# Patient Record
Sex: Female | Born: 1995 | Race: White | Hispanic: No | Marital: Single | Smoking: Never smoker
Health system: Southern US, Community
[De-identification: ages and names within clinical notes are randomized; demographics above are authoritative.]

---

## 2015-02-02 ENCOUNTER — Emergency Department (HOSPITAL_COMMUNITY): Payer: PRIVATE HEALTH INSURANCE

## 2015-02-02 ENCOUNTER — Encounter (HOSPITAL_COMMUNITY): Payer: Self-pay

## 2015-02-02 ENCOUNTER — Inpatient Hospital Stay (HOSPITAL_COMMUNITY): Payer: PRIVATE HEALTH INSURANCE

## 2015-02-02 ENCOUNTER — Inpatient Hospital Stay (HOSPITAL_COMMUNITY)
Admission: EM | Admit: 2015-02-02 | Discharge: 2015-02-05 | DRG: 419 | Disposition: A | Discharge status: Home / Self Care | Payer: PRIVATE HEALTH INSURANCE | Attending: Surgery | Admitting: Surgery

## 2015-02-02 DIAGNOSIS — R319 Hematuria, unspecified: Secondary | ICD-10-CM | POA: Diagnosis present

## 2015-02-02 DIAGNOSIS — F1121 Opioid dependence, in remission: Secondary | ICD-10-CM | POA: Diagnosis present

## 2015-02-02 DIAGNOSIS — R1013 Epigastric pain: Secondary | ICD-10-CM | POA: Diagnosis present

## 2015-02-02 DIAGNOSIS — Z9049 Acquired absence of other specified parts of digestive tract: Secondary | ICD-10-CM

## 2015-02-02 DIAGNOSIS — Z419 Encounter for procedure for purposes other than remedying health state, unspecified: Secondary | ICD-10-CM

## 2015-02-02 DIAGNOSIS — K810 Acute cholecystitis: Principal | ICD-10-CM | POA: Diagnosis present

## 2015-02-02 DIAGNOSIS — R1011 Right upper quadrant pain: Secondary | ICD-10-CM

## 2015-02-02 LAB — URINALYSIS, ROUTINE W REFLEX MICROSCOPIC
BILIRUBIN URINE: NEGATIVE
GLUCOSE, UA: NEGATIVE mg/dL
HGB URINE DIPSTICK: NEGATIVE
Ketones, ur: >80 mg/dL — AB
Leukocytes, UA: NEGATIVE
NITRITE: NEGATIVE
PH: 7 (ref 5.0–8.0)
Protein, ur: NEGATIVE mg/dL
SPECIFIC GRAVITY, URINE: 1.022 (ref 1.005–1.030)

## 2015-02-02 LAB — CBC
HEMATOCRIT: 39.2 % (ref 36.0–46.0)
HEMOGLOBIN: 13.3 g/dL (ref 12.0–15.0)
MCH: 29.8 pg (ref 26.0–34.0)
MCHC: 33.9 g/dL (ref 30.0–36.0)
MCV: 87.7 fL (ref 78.0–100.0)
Platelets: 299 10*3/uL (ref 150–400)
RBC: 4.47 MIL/uL (ref 3.87–5.11)
RDW: 13.3 % (ref 11.5–15.5)
WBC: 23.2 10*3/uL — ABNORMAL HIGH (ref 4.0–10.5)

## 2015-02-02 LAB — COMPREHENSIVE METABOLIC PANEL
ALBUMIN: 4.6 g/dL (ref 3.5–5.0)
ALT: 13 U/L — ABNORMAL LOW (ref 14–54)
ANION GAP: 12 (ref 5–15)
AST: 21 U/L (ref 15–41)
Alkaline Phosphatase: 54 U/L (ref 38–126)
BUN: 12 mg/dL (ref 6–20)
CHLORIDE: 106 mmol/L (ref 101–111)
CO2: 21 mmol/L — AB (ref 22–32)
Calcium: 9.8 mg/dL (ref 8.9–10.3)
Creatinine, Ser: 0.73 mg/dL (ref 0.44–1.00)
GFR calc Af Amer: >60 mL/min (ref >60)
GFR calc non Af Amer: >60 mL/min (ref >60)
GLUCOSE: 147 mg/dL — AB (ref 65–99)
POTASSIUM: 3.1 mmol/L — AB (ref 3.5–5.1)
SODIUM: 139 mmol/L (ref 135–145)
Total Bilirubin: 2.1 mg/dL — ABNORMAL HIGH (ref 0.3–1.2)
Total Protein: 8.3 g/dL — ABNORMAL HIGH (ref 6.5–8.1)

## 2015-02-02 LAB — I-STAT BETA HCG BLOOD, ED (MC, WL, AP ONLY)

## 2015-02-02 LAB — WET PREP, GENITAL
Clue Cells Wet Prep HPF POC: NONE SEEN
SPERM: NONE SEEN
TRICH WET PREP: NONE SEEN
Yeast Wet Prep HPF POC: NONE SEEN

## 2015-02-02 LAB — LIPASE, BLOOD: LIPASE: 20 U/L (ref 11–51)

## 2015-02-02 MED ORDER — PANTOPRAZOLE SODIUM 40 MG IV SOLR
40.0000 mg | Freq: Every day | INTRAVENOUS | Status: DC
  Administered 2015-02-02 – 2015-02-03 (×2): 40 mg via INTRAVENOUS
  Filled 2015-02-02 (×3): qty 40

## 2015-02-02 MED ORDER — IOHEXOL 300 MG/ML  SOLN
50.0000 mL | Freq: Once | INTRAMUSCULAR | Status: DC | PRN
  Administered 2015-02-02: 50 mL via ORAL
  Filled 2015-02-02: qty 50

## 2015-02-02 MED ORDER — MORPHINE SULFATE (PF) 4 MG/ML IV SOLN
4.0000 mg | Freq: Once | INTRAVENOUS | Status: AC
  Administered 2015-02-02: 4 mg via INTRAVENOUS
  Filled 2015-02-02: qty 1

## 2015-02-02 MED ORDER — HEPARIN SODIUM (PORCINE) 5000 UNIT/ML IJ SOLN
5000.0000 [IU] | Freq: Three times a day (TID) | INTRAMUSCULAR | Status: DC
  Administered 2015-02-02 – 2015-02-03 (×4): 5000 [IU] via SUBCUTANEOUS
  Filled 2015-02-02 (×8): qty 1

## 2015-02-02 MED ORDER — KCL IN DEXTROSE-NACL 20-5-0.9 MEQ/L-%-% IV SOLN
INTRAVENOUS | Status: DC
  Administered 2015-02-02 – 2015-02-04 (×5): via INTRAVENOUS
  Filled 2015-02-02 (×6): qty 1000

## 2015-02-02 MED ORDER — SODIUM CHLORIDE 0.9 % IV SOLN: INTRAVENOUS | Status: DC

## 2015-02-02 MED ORDER — ONDANSETRON 4 MG PO TBDP: 4.0000 mg | ORAL_TABLET | Freq: Four times a day (QID) | ORAL | Status: DC | PRN

## 2015-02-02 MED ORDER — SODIUM CHLORIDE 0.9 % IV SOLN
INTRAVENOUS | Status: DC
  Administered 2015-02-02: 19:00:00 via INTRAVENOUS

## 2015-02-02 MED ORDER — POTASSIUM CHLORIDE 10 MEQ/100ML IV SOLN
10.0000 meq | Freq: Once | INTRAVENOUS | Status: DC
  Administered 2015-02-02: 10 meq via INTRAVENOUS
  Filled 2015-02-02: qty 100

## 2015-02-02 MED ORDER — ONDANSETRON HCL 4 MG/2ML IJ SOLN
4.0000 mg | Freq: Four times a day (QID) | INTRAMUSCULAR | Status: DC | PRN
  Administered 2015-02-03 – 2015-02-04 (×2): 4 mg via INTRAVENOUS
  Filled 2015-02-02 (×2): qty 2

## 2015-02-02 MED ORDER — ONDANSETRON HCL 4 MG/2ML IJ SOLN
4.0000 mg | Freq: Once | INTRAMUSCULAR | Status: AC
  Administered 2015-02-02: 4 mg via INTRAVENOUS
  Filled 2015-02-02: qty 2

## 2015-02-02 MED ORDER — IOHEXOL 300 MG/ML  SOLN
80.0000 mL | Freq: Once | INTRAMUSCULAR | Status: AC | PRN
  Administered 2015-02-02: 80 mL via INTRAVENOUS

## 2015-02-02 MED ORDER — PIPERACILLIN-TAZOBACTAM 3.375 G IVPB
3.3750 g | Freq: Three times a day (TID) | INTRAVENOUS | Status: DC
  Administered 2015-02-03 – 2015-02-04 (×4): 3.375 g via INTRAVENOUS
  Filled 2015-02-02 (×5): qty 50

## 2015-02-02 MED ORDER — MORPHINE SULFATE (PF) 2 MG/ML IV SOLN
2.0000 mg | INTRAVENOUS | Status: DC | PRN
  Administered 2015-02-02 – 2015-02-04 (×4): 4 mg via INTRAVENOUS
  Filled 2015-02-02 (×4): qty 2

## 2015-02-02 MED ORDER — METOCLOPRAMIDE HCL 5 MG/ML IJ SOLN
10.0000 mg | Freq: Once | INTRAMUSCULAR | Status: AC
  Administered 2015-02-02: 10 mg via INTRAVENOUS
  Filled 2015-02-02: qty 2

## 2015-02-02 MED ORDER — PIPERACILLIN-TAZOBACTAM 3.375 G IVPB
3.3750 g | Freq: Once | INTRAVENOUS | Status: AC
  Administered 2015-02-02: 3.375 g via INTRAVENOUS
  Filled 2015-02-02: qty 50

## 2015-02-02 MED ORDER — SODIUM CHLORIDE 0.9 % IV BOLUS (SEPSIS)
1000.0000 mL | Freq: Once | INTRAVENOUS | Status: AC
  Administered 2015-02-02: 1000 mL via INTRAVENOUS

## 2015-02-02 MED ORDER — POTASSIUM CHLORIDE 10 MEQ/100ML IV SOLN
10.0000 meq | Freq: Once | INTRAVENOUS | Status: AC
  Administered 2015-02-02: 10 meq via INTRAVENOUS
  Filled 2015-02-02: qty 100

## 2015-02-02 NOTE — Progress Notes (Signed)
Cm still offered pt resources for [place] for providers, [employer] de[facility], DSS, SSI office, dental resources, housing resources and medication resources Discussed www.needymeds.org, www.goodrx.com, discounted pharmacies and other [place] resources such as [facility] , [facility], affordable care act, financial assistance, uninsured dental services, [place] med assist, DSS and  health department  Reviewed resources for [place] uninsured accepting pcps like [facility], [facility] medicine [facility] [address], [facility], [facility], local urgent care centers, [facility], [facility], general medical clinics, [facility] [place], [facility] plus others, medication resources, [facility] out patient [facility]ies and housing Pt voiced understanding and appreciation of resources provided

## 2015-02-02 NOTE — ED Provider Notes (Signed)
1720 5 PM patient continues to complain of upper abdominal pain, localized to right upper quadrant pain associated with nausea. Pain started last night after eating pizza. She's had similar pain in the past after eating "heavy meals". On exam she is alert and nontoxic abdomen nondistended, tender over right upper quadrant negative Murphy sign. I suspect the patient has acute cholecystitis in light of right upper quadrant pain, tenderness, gallbladder wall thickening, leukocytosis I consulted with Dr. Johna Sheriff who will arrange for admission. Intravenous antibiotics and intravenous hydration. He will check on abdominal ultrasound. Results for orders placed or performed during the hospital encounter of 02/02/15  Wet prep, genital  Result Value Ref Range   Yeast Wet Prep HPF POC NONE SEEN NONE SEEN   Trich, Wet Prep NONE SEEN NONE SEEN   Clue Cells Wet Prep HPF POC NONE SEEN NONE SEEN   WBC, Wet Prep HPF POC MODERATE (A) NONE SEEN   Sperm NONE SEEN   Lipase, blood  Result Value Ref Range   Lipase 20 11 - 51 U/L  Comprehensive metabolic panel  Result Value Ref Range   Sodium 139 135 - 145 mmol/L   Potassium 3.1 (L) 3.5 - 5.1 mmol/L   Chloride 106 101 - 111 mmol/L   CO2 21 (L) 22 - 32 mmol/L   Glucose, Bld 147 (H) 65 - 99 mg/dL   BUN 12 6 - 20 mg/dL   Creatinine, Ser 4.09 0.44 - 1.00 mg/dL   Calcium 9.8 8.9 - 81.1 mg/dL   Total Protein 8.3 (H) 6.5 - 8.1 g/dL   Albumin 4.6 3.5 - 5.0 g/dL   AST 21 15 - 41 U/L   ALT 13 (L) 14 - 54 U/L   Alkaline Phosphatase 54 38 - 126 U/L   Total Bilirubin 2.1 (H) 0.3 - 1.2 mg/dL   GFR calc non Af Amer >60 >60 mL/min   GFR calc Af Amer >60 >60 mL/min   Anion gap 12 5 - 15  CBC  Result Value Ref Range   WBC 23.2 (H) 4.0 - 10.5 K/uL   RBC 4.47 3.87 - 5.11 MIL/uL   Hemoglobin 13.3 12.0 - 15.0 g/dL   HCT 91.4 78.2 - 95.6 %   MCV 87.7 78.0 - 100.0 fL   MCH 29.8 26.0 - 34.0 pg   MCHC 33.9 30.0 - 36.0 g/dL   RDW 21.3 08.6 - 57.8 %   Platelets 299 150 - 400  K/uL  Urinalysis, Routine w reflex microscopic (not at [facility])  Result Value Ref Range   Color, Urine YELLOW YELLOW   APPearance CLOUDY (A) CLEAR   Specific Gravity, Urine 1.022 1.005 - 1.030   pH 7.0 5.0 - 8.0   Glucose, UA NEGATIVE NEGATIVE mg/dL   Hgb urine dipstick NEGATIVE NEGATIVE   Bilirubin Urine NEGATIVE NEGATIVE   Ketones, ur >80 (A) NEGATIVE mg/dL   Protein, ur NEGATIVE NEGATIVE mg/dL   Nitrite NEGATIVE NEGATIVE   Leukocytes, UA NEGATIVE NEGATIVE  I-Stat beta hCG blood, ED ([facility], [facility], [facility] only)  Result Value Ref Range   I-stat hCG, quantitative <5.0 <5 mIU/mL   Comment 3           Ct Abdomen Pelvis W Contrast  02/02/2015  CLINICAL DATA:  19 year old with mid epigastric abdominal pain. Non bilious emesis. Hematuria. EXAM: CT ABDOMEN AND PELVIS WITH CONTRAST TECHNIQUE: Multidetector CT imaging of the abdomen and pelvis was performed using the standard protocol following bolus administration of intravenous contrast. CONTRAST:  50mL OMNIPAQUE IOHEXOL  300 MG/ML SOLN, 80mL OMNIPAQUE IOHEXOL 300 MG/ML SOLN COMPARISON:  None. FINDINGS: Lower chest: Subtle ground-glass and patchy densities at the lung bases, particularly in the right middle lobe and right lower lobe. Findings concerning for infection. Hepatobiliary: There is periportal edema in the liver. 1.1 cm enhancing or dense lesion along the left hepatic dome could represent a flash filling hemangioma. Portal venous system is patent. There appears to be gallbladder wall thickening. Difficult to exclude pericholecystic fluid. Normal caliber of the common bile duct. Pancreas: Normal appearance of the pancreas without inflammation or duct dilatation. Spleen: Normal appearance of spleen without inflammation. Adrenals/Urinary Tract: 0.8 cm cyst in the right kidney upper pole. Normal appearance of the adrenal glands. Incidentally, there is a circumaortic left renal vein. Evidence for nonobstructive punctate stone in left kidney lower pole. Probable  extrarenal pelvis on the left side. Stomach/Bowel: There is marked narrowing of the proximal duodenum from the gallbladder. Normal appearance of small bowel without dilatation. Normal appearance of the colon. Appendix identified in the right pelvic region without inflammatory changes. Vascular/Lymphatic: Small low-density nodes in the left periaortic region. Normal caliber of the abdominal aorta and iliac arteries. IVC and iliac veins are patent. Reproductive: Small amount of free fluid in the pelvis. Limited evaluation of the uterus and adnexal tissue. Other: Negative for free air. Musculoskeletal: No acute abnormality. IMPRESSION: Subtle parenchymal densities at the lung bases, particularly on the right side. Findings are concerning for subtle infectious or inflammatory process. Unusual configuration of the gallbladder raises concern for wall thickening and cannot exclude pericholecystic fluid. In addition, there is periportal edema. Recommend further characterization with a right upper quadrant ultrasound. Nonobstructive left kidney stone. Multiple small low-density nodes along the left side of the aorta. These findings are nonspecific and could be reactive in etiology. No other significant lymphadenopathy. Small amount of free fluid in the pelvis could be physiologic but indeterminate. 1.1 cm dense or enhancing lesion along the left hepatic dome. This is likely an incidental finding for a patient of this age and could represent a flash filling hemangioma. Electronically Signed   By: Richarda OverlieAdam  Henn M.D.   On: 02/02/2015 16:54     Doug SouSam Rosie Torrez, MD 02/03/15 [phone]

## 2015-02-02 NOTE — ED Notes (Signed)
MD at bedside. 

## 2015-02-02 NOTE — ED Notes (Signed)
Surgery MD at bedside.

## 2015-02-02 NOTE — ED Notes (Signed)
Patient made aware of pending urine specimen. Patient will call out when ready.

## 2015-02-02 NOTE — ED Notes (Signed)
Patient on cardiac monitor

## 2015-02-02 NOTE — ED Provider Notes (Addendum)
CSN: [id number]     Arrival date & time 02/02/15  1118 History   First MD Initiated Contact with Patient 02/02/15 1154     Chief Complaint  Patient presents with  . Nausea  . Hematuria  . Abdominal Pain  . Cough     (Consider location/radiation/quality/duration/timing/severity/associated sxs/prior Treatment) HPI Comments: Patient here complaining of midepigastric abdominal pain with associated congestion. No fever or chills. Has had nonbilious emesis 3 without diarrhea. Was seen in urgent care center where she had a negative pregnancy test. Denies any dysuria or vaginal bleeding or discharge. Given Zofran prior to arrival Denies any sore throat. Slight congestion noted. Sent from urgent care to rule out appendicitis due to her current symptoms.  Patient is a 19 y.o. female presenting with hematuria, abdominal pain, and cough. The history is provided by the patient.  Hematuria Associated symptoms include abdominal pain.  Abdominal Pain Associated symptoms: cough and hematuria   Cough   History reviewed. No pertinent past medical history. History reviewed. No pertinent past surgical history. History reviewed. No pertinent family history. Social History  Substance Use Topics  . Smoking status: Never Smoker   . Smokeless tobacco: Never Used  . Alcohol Use: No   OB History    No data available     Review of Systems  Respiratory: Positive for cough.   Gastrointestinal: Positive for abdominal pain.  Genitourinary: Positive for hematuria.  All other systems reviewed and are negative.     Allergies  Review of patient's allergies indicates no known allergies.  Home Medications   Prior to Admission medications   Not on File   BP 112/64 mmHg  Pulse 60  Temp(Src) 97.8 F (36.6 C) (Oral)  Resp 16  Ht  (1.6 m)  Wt 100 lb (45.36 kg)  BMI 17.72 kg/m2  SpO2 100%  LMP 01/02/2015 Physical Exam  Constitutional: She is oriented to person, place, and time. She appears  well-developed and well-nourished.  Non-toxic appearance. No distress.  HENT:  Head: Normocephalic and atraumatic.  Eyes: Conjunctivae, EOM and lids are normal. Pupils are equal, round, and reactive to light.  Neck: Normal range of motion. Neck supple. No tracheal deviation present. No thyroid mass present.  Cardiovascular: Normal rate, regular rhythm and normal heart sounds.  Exam reveals no gallop.   No murmur heard. Pulmonary/Chest: Effort normal and breath sounds normal. No stridor. No respiratory distress. She has no decreased breath sounds. She has no wheezes. She has no rhonchi. She has no rales.  Abdominal: Soft. Normal appearance and bowel sounds are normal. She exhibits no distension. There is no tenderness. There is no rebound and no CVA tenderness.    Genitourinary: Vagina normal. Cervix exhibits no motion tenderness.  Musculoskeletal: Normal range of motion. She exhibits no edema or tenderness.  Neurological: She is alert and oriented to person, place, and time. She has normal strength. No cranial nerve deficit or sensory deficit. GCS eye subscore is 4. GCS verbal subscore is 5. GCS motor subscore is 6.  Skin: Skin is warm and dry. No abrasion and no rash noted.  Psychiatric: She has a normal mood and affect. Her speech is normal and behavior is normal.  Nursing note and vitals reviewed.   ED Course  Procedures (including critical care time) Labs Review Labs Reviewed  LIPASE, BLOOD  COMPREHENSIVE METABOLIC PANEL  CBC  URINALYSIS, ROUTINE W REFLEX MICROSCOPIC (NOT AT [facility])  I-STAT BETA HCG BLOOD, ED ([facility], [facility], [facility] ONLY)    Imaging  Review No results found. I have personally reviewed and evaluated these images and lab results as part of my medical decision-making.   EKG Interpretation None      MDM   Final diagnoses:  None    Pt given potassium for hypokalemia. Patient abdominal CT pending. Signed out to Dr. Clovis CaoJacobowitz    Americo Vallery, MD 02/02/15  1551  Lorre NickAnthony Ksenia Kunz, MD 02/05/15 [phone]

## 2015-02-02 NOTE — ED Notes (Signed)
Patient aware that an urine sample is needed. Patients states she is unable to void at this time. Patient encouraged to void when able

## 2015-02-02 NOTE — ED Notes (Signed)
Patient transported to CT 

## 2015-02-02 NOTE — H&P (Signed)
Elizabeth Mcknight is an 19 y.o. female.    Chief Complaint: Abdominal pain, nausea and vomiting  HPI: Patient is a 19 year old female who presents to the emergency room with worsening nausea and vomiting and epigastric pain. On questioning the patient has not been feeling well for about 3 months. She describes recurrent epigastric pain and nausea, often after eating or on waking up in the morning. She has not seen a physician for this. She also has had fatigue and malaise related to this illness. For about 3 days she has had for aggressively worsening symptoms consisting mainly of persistent nausea and vomiting and intolerance of oral intake as well as worsening epigastric pain. She has felt chilled but temperature has been normal when she took it. No diarrhea or constipation. No urinary symptoms. No jaundice. She denies previous history of significant GI illness.  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 No pertinent past surgical history.  History reviewed. No pertinent family history. Social History:  reports that she has never smoked. She has never used smokeless tobacco. She reports that she does not drink alcohol or use illicit drugs.  Allergies: No Known Allergies   Current Facility-Administered Medications  Medication Dose Route Frequency Provider Last Rate Last Dose  . 0.9 %  sodium chloride infusion   Intravenous Continuous Orlie Dakin, MD 125 mL/hr at 02/02/15 1831    . iohexol (OMNIPAQUE) 300 MG/ML solution 50 mL  50 mL Oral Once PRN Medication Radiologist, MD   50 mL at 02/02/15 1624  . piperacillin-tazobactam (ZOSYN) IVPB 3.375 g  3.375 g Intravenous Once Orlie Dakin, MD 12.5 mL/hr at 02/02/15 1834 3.375 g at 02/02/15 1834  . potassium chloride 10 mEq in 100 mL IVPB  10 mEq Intravenous Once Orlie Dakin, MD 100 mL/hr at 02/02/15 1834 10 mEq at 02/02/15 1834   Current Outpatient Prescriptions   Medication Sig Dispense Refill  . Aspirin-Acetaminophen-Caffeine (EXCEDRIN PO) Take 1-2 tablets by mouth 2 (two) times daily as needed (for pain).    . Ibuprofen (MIDOL) 200 MG CAPS Take 1 capsule by mouth every 6 (six) hours as needed (for pain).       Results for orders placed or performed during the hospital encounter of 02/02/15 (from the past 48 hour(s))  Lipase, blood     Status: None   Collection Time: 02/02/15 11:55 AM  Result Value Ref Range   Lipase 20 11 - 51 U/L  Comprehensive metabolic panel     Status: Abnormal   Collection Time: 02/02/15 11:55 AM  Result Value Ref Range   Sodium 139 135 - 145 mmol/L   Potassium 3.1 (L) 3.5 - 5.1 mmol/L   Chloride 106 101 - 111 mmol/L   CO2 21 (L) 22 - 32 mmol/L   Glucose, Bld 147 (H) 65 - 99 mg/dL   BUN 12 6 - 20 mg/dL   Creatinine, Ser 0.73 0.44 - 1.00 mg/dL   Calcium 9.8 8.9 - 10.3 mg/dL   Total Protein 8.3 (H) 6.5 - 8.1 g/dL   Albumin 4.6 3.5 - 5.0 g/dL   AST 21 15 - 41 U/L   ALT 13 (L) 14 - 54 U/L   Alkaline Phosphatase 54 38 - 126 U/L   Total Bilirubin 2.1 (H) 0.3 - 1.2 mg/dL   GFR calc non Af Amer >60 >60 mL/min   GFR calc Af Amer >60 >60 mL/min    Comment: (NOTE) The eGFR has  been calculated using the CKD EPI equation. This calculation has not been validated in all clinical situations. eGFR's persistently <60 mL/min signify possible Chronic Kidney Disease.    Anion gap 12 5 - 15  CBC     Status: Abnormal   Collection Time: 02/02/15 11:55 AM  Result Value Ref Range   WBC 23.2 (H) 4.0 - 10.5 K/uL   RBC 4.47 3.87 - 5.11 MIL/uL   Hemoglobin 13.3 12.0 - 15.0 g/dL   HCT 39.2 36.0 - 46.0 %   MCV 87.7 78.0 - 100.0 fL   MCH 29.8 26.0 - 34.0 pg   MCHC 33.9 30.0 - 36.0 g/dL   RDW 13.3 11.5 - 15.5 %   Platelets 299 150 - 400 K/uL  I-Stat beta hCG blood, ED ([facility], [facility], [facility] only)     Status: None   Collection Time: 02/02/15 12:05 PM  Result Value Ref Range   I-stat hCG, quantitative <5.0 <5 mIU/mL   Comment 3             Comment:   GEST. AGE      CONC.  (mIU/mL)   <=1 WEEK        5 - 50     2 WEEKS       50 - 500     3 WEEKS       100 - 10,000     4 WEEKS     1,000 - 30,000        FEMALE AND NON-PREGNANT FEMALE:     LESS THAN 5 mIU/mL   Urinalysis, Routine w reflex microscopic (not at [facility])     Status: Abnormal   Collection Time: 02/02/15  3:57 PM  Result Value Ref Range   Color, Urine YELLOW YELLOW   APPearance CLOUDY (A) CLEAR   Specific Gravity, Urine 1.022 1.005 - 1.030   pH 7.0 5.0 - 8.0   Glucose, UA NEGATIVE NEGATIVE mg/dL   Hgb urine dipstick NEGATIVE NEGATIVE   Bilirubin Urine NEGATIVE NEGATIVE   Ketones, ur >80 (A) NEGATIVE mg/dL   Protein, ur NEGATIVE NEGATIVE mg/dL   Nitrite NEGATIVE NEGATIVE   Leukocytes, UA NEGATIVE NEGATIVE    Comment: MICROSCOPIC NOT DONE ON URINES WITH NEGATIVE PROTEIN, BLOOD, LEUKOCYTES, NITRITE, OR GLUCOSE <1000 mg/dL.  Wet prep, genital     Status: Abnormal   Collection Time: 02/02/15  4:05 PM  Result Value Ref Range   Yeast Wet Prep HPF POC NONE SEEN NONE SEEN   Trich, Wet Prep NONE SEEN NONE SEEN   Clue Cells Wet Prep HPF POC NONE SEEN NONE SEEN   WBC, Wet Prep HPF POC MODERATE (A) NONE SEEN   Sperm NONE SEEN    Ct Abdomen Pelvis W Contrast  02/02/2015  CLINICAL DATA:  19 year old with mid epigastric abdominal pain. Non bilious emesis. Hematuria. EXAM: CT ABDOMEN AND PELVIS WITH CONTRAST TECHNIQUE: Multidetector CT imaging of the abdomen and pelvis was performed using the standard protocol following bolus administration of intravenous contrast. CONTRAST:  60m OMNIPAQUE IOHEXOL 300 MG/ML SOLN, 875mOMNIPAQUE IOHEXOL 300 MG/ML SOLN COMPARISON:  None. FINDINGS: Lower chest: Subtle ground-glass and patchy densities at the lung bases, particularly in the right middle lobe and right lower lobe. Findings concerning for infection. Hepatobiliary: There is periportal edema in the liver. 1.1 cm enhancing or dense lesion along the left hepatic dome could represent a flash  filling hemangioma. Portal venous system is patent. There appears to be gallbladder wall thickening. Difficult to exclude pericholecystic fluid.  Normal caliber of the common bile duct. Pancreas: Normal appearance of the pancreas without inflammation or duct dilatation. Spleen: Normal appearance of spleen without inflammation. Adrenals/Urinary Tract: 0.8 cm cyst in the right kidney upper pole. Normal appearance of the adrenal glands. Incidentally, there is a circumaortic left renal vein. Evidence for nonobstructive punctate stone in left kidney lower pole. Probable extrarenal pelvis on the left side. Stomach/Bowel: There is marked narrowing of the proximal duodenum from the gallbladder. Normal appearance of small bowel without dilatation. Normal appearance of the colon. Appendix identified in the right pelvic region without inflammatory changes. Vascular/Lymphatic: Small low-density nodes in the left periaortic region. Normal caliber of the abdominal aorta and iliac arteries. IVC and iliac veins are patent. Reproductive: Small amount of free fluid in the pelvis. Limited evaluation of the uterus and adnexal tissue. Other: Negative for free air. Musculoskeletal: No acute abnormality. IMPRESSION: Subtle parenchymal densities at the lung bases, particularly on the right side. Findings are concerning for subtle infectious or inflammatory process. Unusual configuration of the gallbladder raises concern for wall thickening and cannot exclude pericholecystic fluid. In addition, there is periportal edema. Recommend further characterization with a right upper quadrant ultrasound. Nonobstructive left kidney stone. Multiple small low-density nodes along the left side of the aorta. These findings are nonspecific and could be reactive in etiology. No other significant lymphadenopathy. Small amount of free fluid in the pelvis could be physiologic but indeterminate. 1.1 cm dense or enhancing lesion along the left hepatic dome. This  is likely an incidental finding for a patient of this age and could represent a flash filling hemangioma. Electronically Signed   By: Markus Daft M.D.   On: 02/02/2015 16:54    Review of Systems  Constitutional: Positive for chills and malaise/fatigue. Negative for fever and weight loss.  Cardiovascular: Negative.   Gastrointestinal: Positive for nausea, vomiting and abdominal pain. Negative for diarrhea, constipation and blood in stool.  Genitourinary: Negative.   Musculoskeletal: Negative.   Neurological: Positive for weakness.  Psychiatric/Behavioral: The patient is nervous/anxious.     Blood pressure 106/85, pulse 53, temperature 97.8 F (36.6 C), temperature source Oral, resp. rate 16, height _0  (1.6 m), weight 45.36 kg (100 lb), last menstrual period 01/02/2015, SpO2 100 %. Physical Exam  General: Alert, Thin Caucasian female who appears uncomfortable, in no Severe distress Skin: Warm and dry without rash or infection.  Multiple tattoos. HEENT: No palpable masses or thyromegaly. Sclera nonicteric. Pupils equal round and reactive. Oropharynx clear. Lymph nodes: No cervical, supraclavicular, or inguinal nodes palpable. Lungs: Breath sounds clear and equal without increased work of breathing Cardiovascular: Regular rate and rhythm without murmur. No JVD or edema. Peripheral pulses intact. Abdomen: Nondistended. Moderate diffuse abdominal tenderness without guarding but seems mostly localized in the epigastrium.. No masses palpable. No organomegaly. No palpable hernias. Extremities: No edema or joint swelling or deformity. No chronic venous stasis changes. Neurologic: Alert and fully oriented. No gross motor or sensory deficits  Assessment/Plan Several months of worsening symptoms of abdominal pain and vomiting culminating in severe nausea and vomiting with worsening epigastric pain. Significantly elevated white count and mildly elevated bilirubin. CT scan suggests possibly marked  edema of the gallbladder and pericholecystic fluid. Also some question of infectious process in the lower right lung. I suspect she may well have severe acute cholecystitis. Patient is being admitted and placed on broad-spectrum IV antibiotics and IV hydration. Obtain ultrasound of the gallbladder for further characterization. This was all discussed with the patient who  understands and agrees.  Jeilyn Reznik T 02/02/2015, 6:15 PM

## 2015-02-02 NOTE — ED Notes (Signed)
Patient c/o productive cough with green sputum, abdominal pain, nausea since yesterday. Patient states while at the UC today she had hematuria. Patient states she went to an UC today and was told to come to the ED to r/o appendicitis.

## 2015-02-02 NOTE — Progress Notes (Signed)
Pt confirms no pcp  Female visitor at bedside states pt's mother sent copy of insurance information to them after they paid for the ED visit Cm walked with him to ED registration to enter in the information  [facility] ED CM noted pt with coverage but no pcp listed Spoke with pt who confirms no pcp [facility] ED CM spoke with pt on how to obtain an in network pcp with insurance coverage via the customer service number or web site  Cm reviewed ED level of care for crisis/emergent services and community pcp level of care to manage continuous or chronic medical concerns.  The pt voiced understanding CM encouraged pt and discussed pt's responsibility to verify with pt's insurance carrier that any recommended medical provider offered by any emergency room or a hospital provider is within the carrier's network. The pt voiced understanding

## 2015-02-02 NOTE — ED Notes (Signed)
Pt info me blood in urine

## 2015-02-03 LAB — COMPREHENSIVE METABOLIC PANEL
ALT: 12 U/L — AB (ref 14–54)
AST: 14 U/L — AB (ref 15–41)
Albumin: 3.3 g/dL — ABNORMAL LOW (ref 3.5–5.0)
Alkaline Phosphatase: 35 U/L — ABNORMAL LOW (ref 38–126)
Anion gap: 4 — ABNORMAL LOW (ref 5–15)
BILIRUBIN TOTAL: 1 mg/dL (ref 0.3–1.2)
BUN: 8 mg/dL (ref 6–20)
CALCIUM: 8 mg/dL — AB (ref 8.9–10.3)
CO2: 22 mmol/L (ref 22–32)
CREATININE: 0.58 mg/dL (ref 0.44–1.00)
Chloride: 112 mmol/L — ABNORMAL HIGH (ref 101–111)
GFR calc Af Amer: >60 mL/min (ref >60)
Glucose, Bld: 128 mg/dL — ABNORMAL HIGH (ref 65–99)
Potassium: 3.9 mmol/L (ref 3.5–5.1)
Sodium: 138 mmol/L (ref 135–145)
TOTAL PROTEIN: 5.7 g/dL — AB (ref 6.5–8.1)

## 2015-02-03 LAB — GC/CHLAMYDIA PROBE AMP (~~LOC~~) NOT AT ARMC
Chlamydia: NEGATIVE
Neisseria Gonorrhea: NEGATIVE

## 2015-02-03 LAB — CBC
HCT: 30.7 % — ABNORMAL LOW (ref 36.0–46.0)
Hemoglobin: 10.2 g/dL — ABNORMAL LOW (ref 12.0–15.0)
MCH: 29.3 pg (ref 26.0–34.0)
MCHC: 33.2 g/dL (ref 30.0–36.0)
MCV: 88.2 fL (ref 78.0–100.0)
PLATELETS: 194 10*3/uL (ref 150–400)
RBC: 3.48 MIL/uL — ABNORMAL LOW (ref 3.87–5.11)
RDW: 13.6 % (ref 11.5–15.5)
WBC: 14.5 10*3/uL — AB (ref 4.0–10.5)

## 2015-02-03 MED ORDER — ASPIRIN-ACETAMINOPHEN-CAFFEINE 250-250-65 MG PO TABS
1.0000 | ORAL_TABLET | Freq: Four times a day (QID) | ORAL | Status: DC | PRN
  Filled 2015-02-03: qty 1

## 2015-02-03 MED ORDER — IBUPROFEN 200 MG PO TABS
200.0000 mg | ORAL_TABLET | Freq: Four times a day (QID) | ORAL | Status: DC | PRN
  Administered 2015-02-03: 200 mg via ORAL
  Filled 2015-02-03: qty 1

## 2015-02-03 MED ORDER — LACTATED RINGERS IV BOLUS (SEPSIS): 1000.0000 mL | Freq: Three times a day (TID) | INTRAVENOUS | Status: DC | PRN

## 2015-02-03 MED ORDER — LACTATED RINGERS IV BOLUS (SEPSIS)
1000.0000 mL | Freq: Once | INTRAVENOUS | Status: AC
  Administered 2015-02-03: 1000 mL via INTRAVENOUS

## 2015-02-03 NOTE — Progress Notes (Signed)
Patient ID: Elizabeth Mcknight, female   DOB: 22-Oct-1995, 19 y.o.   MRN: [id number]      [facility]      [address] [place], [place] [address]    Phone: [phone] FAX: [phone]     Subjective: Intermittent post prandial symptoms x3 months now.  Also reports URI symptoms last 5 days.   No further fevers. WBC trending down.   Objective:  Vital signs:  Filed Vitals:   02/03/15 0204 02/03/15 0545 02/03/15 0820 02/03/15 1352  BP: 92/59 100/67 116/73 107/68  Pulse: 52 79 82 87  Temp: 98 F (36.7 C) 98.9 F (37.2 C) 98.2 F (36.8 C) 99.3 F (37.4 C)  TempSrc: Oral Oral Oral Oral  Resp: 16 16 16 18   Height:      Weight:      SpO2: 100% 100% 96% 98%    Last BM Date: 02/01/15  Intake/Output   Yesterday:  11/15 0701 - 11/16 0700 In: 1177.5 [P.O.:240; I.V.:887.5; IV Piggyback:50] Out: 1250 [Urine:1250] This shift:  Total I/O In: 840 [P.O.:840] Out: 600 [Urine:600]  Physical Exam: General: Pt awake/alert/oriented x4 in no acute distress Chest: cta. No chest wall pain w good excursion CV:  Pulses intact.  Regular rhythm MS: Normal AROM mjr joints.  No obvious deformity Abdomen: Soft.  Nondistended.  ttp epigastrium.  No evidence of peritonitis.  No incarcerated hernias. Ext:  SCDs BLE.  No mjr edema.  No cyanosis Skin: No petechiae / purpura   Problem List:   Active Problems:   Epigastric abdominal pain    Results:   Labs: Results for orders placed or performed during the hospital encounter of 02/02/15 (from the past 48 hour(s))  Lipase, blood     Status: None   Collection Time: 02/02/15 11:55 AM  Result Value Ref Range   Lipase 20 11 - 51 U/L  Comprehensive metabolic panel     Status: Abnormal   Collection Time: 02/02/15 11:55 AM  Result Value Ref Range   Sodium 139 135 - 145 mmol/L   Potassium 3.1 (L) 3.5 - 5.1 mmol/L   Chloride 106 101 - 111 mmol/L   CO2 21 (L) 22 - 32 mmol/L   Glucose, Bld  147 (H) 65 - 99 mg/dL   BUN 12 6 - 20 mg/dL   Creatinine, Ser 0.73 0.44 - 1.00 mg/dL   Calcium 9.8 8.9 - 10.3 mg/dL   Total Protein 8.3 (H) 6.5 - 8.1 g/dL   Albumin 4.6 3.5 - 5.0 g/dL   AST 21 15 - 41 U/L   ALT 13 (L) 14 - 54 U/L   Alkaline Phosphatase 54 38 - 126 U/L   Total Bilirubin 2.1 (H) 0.3 - 1.2 mg/dL   GFR calc non Af Amer >60 >60 mL/min   GFR calc Af Amer >60 >60 mL/min    Comment: (NOTE) The eGFR has been calculated using the CKD EPI equation. This calculation has not been validated in all clinical situations. eGFR's persistently <60 mL/min signify possible Chronic Kidney Disease.    Anion gap 12 5 - 15  CBC     Status: Abnormal   Collection Time: 02/02/15 11:55 AM  Result Value Ref Range   WBC 23.2 (H) 4.0 - 10.5 K/uL   RBC 4.47 3.87 - 5.11 MIL/uL   Hemoglobin 13.3 12.0 - 15.0 g/dL   HCT 39.2 36.0 - 46.0 %   MCV 87.7 78.0 - 100.0 fL   MCH 29.8 26.0 -  34.0 pg   MCHC 33.9 30.0 - 36.0 g/dL   RDW 13.3 11.5 - 15.5 %   Platelets 299 150 - 400 K/uL  I-Stat beta hCG blood, ED ([facility], [facility], [facility] only)     Status: None   Collection Time: 02/02/15 12:05 PM  Result Value Ref Range   I-stat hCG, quantitative <5.0 <5 mIU/mL   Comment 3            Comment:   GEST. AGE      CONC.  (mIU/mL)   <=1 WEEK        5 - 50     2 WEEKS       50 - 500     3 WEEKS       100 - 10,000     4 WEEKS     1,000 - 30,000        FEMALE AND NON-PREGNANT FEMALE:     LESS THAN 5 mIU/mL   Urinalysis, Routine w reflex microscopic (not at [facility])     Status: Abnormal   Collection Time: 02/02/15  3:57 PM  Result Value Ref Range   Color, Urine YELLOW YELLOW   APPearance CLOUDY (A) CLEAR   Specific Gravity, Urine 1.022 1.005 - 1.030   pH 7.0 5.0 - 8.0   Glucose, UA NEGATIVE NEGATIVE mg/dL   Hgb urine dipstick NEGATIVE NEGATIVE   Bilirubin Urine NEGATIVE NEGATIVE   Ketones, ur >80 (A) NEGATIVE mg/dL   Protein, ur NEGATIVE NEGATIVE mg/dL   Nitrite NEGATIVE NEGATIVE   Leukocytes, UA NEGATIVE NEGATIVE     Comment: MICROSCOPIC NOT DONE ON URINES WITH NEGATIVE PROTEIN, BLOOD, LEUKOCYTES, NITRITE, OR GLUCOSE <1000 mg/dL.  Wet prep, genital     Status: Abnormal   Collection Time: 02/02/15  4:05 PM  Result Value Ref Range   Yeast Wet Prep HPF POC NONE SEEN NONE SEEN   Trich, Wet Prep NONE SEEN NONE SEEN   Clue Cells Wet Prep HPF POC NONE SEEN NONE SEEN   WBC, Wet Prep HPF POC MODERATE (A) NONE SEEN   Sperm NONE SEEN   Comprehensive metabolic panel     Status: Abnormal   Collection Time: 02/03/15  4:48 AM  Result Value Ref Range   Sodium 138 135 - 145 mmol/L   Potassium 3.9 3.5 - 5.1 mmol/L    Comment: DELTA CHECK NOTED REPEATED TO VERIFY NO VISIBLE HEMOLYSIS    Chloride 112 (H) 101 - 111 mmol/L   CO2 22 22 - 32 mmol/L   Glucose, Bld 128 (H) 65 - 99 mg/dL   BUN 8 6 - 20 mg/dL   Creatinine, Ser 0.58 0.44 - 1.00 mg/dL   Calcium 8.0 (L) 8.9 - 10.3 mg/dL   Total Protein 5.7 (L) 6.5 - 8.1 g/dL   Albumin 3.3 (L) 3.5 - 5.0 g/dL   AST 14 (L) 15 - 41 U/L   ALT 12 (L) 14 - 54 U/L   Alkaline Phosphatase 35 (L) 38 - 126 U/L   Total Bilirubin 1.0 0.3 - 1.2 mg/dL   GFR calc non Af Amer >60 >60 mL/min   GFR calc Af Amer >60 >60 mL/min    Comment: (NOTE) The eGFR has been calculated using the CKD EPI equation. This calculation has not been validated in all clinical situations. eGFR's persistently <60 mL/min signify possible Chronic Kidney Disease.    Anion gap 4 (L) 5 - 15  CBC     Status: Abnormal   Collection Time: 02/03/15  4:48 AM  Result Value Ref Range   WBC 14.5 (H) 4.0 - 10.5 K/uL   RBC 3.48 (L) 3.87 - 5.11 MIL/uL   Hemoglobin 10.2 (L) 12.0 - 15.0 g/dL    Comment: DELTA CHECK NOTED REPEATED TO VERIFY    HCT 30.7 (L) 36.0 - 46.0 %   MCV 88.2 78.0 - 100.0 fL   MCH 29.3 26.0 - 34.0 pg   MCHC 33.2 30.0 - 36.0 g/dL   RDW 13.6 11.5 - 15.5 %   Platelets 194 150 - 400 K/uL    Imaging / Studies: Ct Abdomen Pelvis W Contrast  02/02/2015  CLINICAL DATA:  19 year old with mid  epigastric abdominal pain. Non bilious emesis. Hematuria. EXAM: CT ABDOMEN AND PELVIS WITH CONTRAST TECHNIQUE: Multidetector CT imaging of the abdomen and pelvis was performed using the standard protocol following bolus administration of intravenous contrast. CONTRAST:  67m OMNIPAQUE IOHEXOL 300 MG/ML SOLN, 887mOMNIPAQUE IOHEXOL 300 MG/ML SOLN COMPARISON:  None. FINDINGS: Lower chest: Subtle ground-glass and patchy densities at the lung bases, particularly in the right middle lobe and right lower lobe. Findings concerning for infection. Hepatobiliary: There is periportal edema in the liver. 1.1 cm enhancing or dense lesion along the left hepatic dome could represent a flash filling hemangioma. Portal venous system is patent. There appears to be gallbladder wall thickening. Difficult to exclude pericholecystic fluid. Normal caliber of the common bile duct. Pancreas: Normal appearance of the pancreas without inflammation or duct dilatation. Spleen: Normal appearance of spleen without inflammation. Adrenals/Urinary Tract: 0.8 cm cyst in the right kidney upper pole. Normal appearance of the adrenal glands. Incidentally, there is a circumaortic left renal vein. Evidence for nonobstructive punctate stone in left kidney lower pole. Probable extrarenal pelvis on the left side. Stomach/Bowel: There is marked narrowing of the proximal duodenum from the gallbladder. Normal appearance of small bowel without dilatation. Normal appearance of the colon. Appendix identified in the right pelvic region without inflammatory changes. Vascular/Lymphatic: Small low-density nodes in the left periaortic region. Normal caliber of the abdominal aorta and iliac arteries. IVC and iliac veins are patent. Reproductive: Small amount of free fluid in the pelvis. Limited evaluation of the uterus and adnexal tissue. Other: Negative for free air. Musculoskeletal: No acute abnormality. IMPRESSION: Subtle parenchymal densities at the lung bases,  particularly on the right side. Findings are concerning for subtle infectious or inflammatory process. Unusual configuration of the gallbladder raises concern for wall thickening and cannot exclude pericholecystic fluid. In addition, there is periportal edema. Recommend further characterization with a right upper quadrant ultrasound. Nonobstructive left kidney stone. Multiple small low-density nodes along the left side of the aorta. These findings are nonspecific and could be reactive in etiology. No other significant lymphadenopathy. Small amount of free fluid in the pelvis could be physiologic but indeterminate. 1.1 cm dense or enhancing lesion along the left hepatic dome. This is likely an incidental finding for a patient of this age and could represent a flash filling hemangioma. Electronically Signed   By: AdMarkus Daft.D.   On: 02/02/2015 16:54   UsKoreabdomen Limited  02/02/2015  CLINICAL DATA:  Acute onset of right upper quadrant abdominal pain and tenderness. Nausea and vomiting. Initial encounter. EXAM: US[place]oreaBDOMEN LIMITED - RIGHT UPPER QUADRANT COMPARISON:  None. FINDINGS: Gallbladder: There is suggestion of trace pericholecystic fluid. The gallbladder wall is borderline normal in thickness. No ultrasonographic Murphy's sign is elicited. No stones are identified. Common bile duct: Diameter: 0.2 cm, within normal limits in caliber.  Liver: A mildly hyperechoic 1.5 cm focus at the hepatic dome may reflect a small hemangioma. Within normal limits in parenchymal echogenicity. IMPRESSION: 1. Trace pericholecystic fluid noted, of uncertain significance. No definite evidence for obstruction or cholecystitis. 2. Mildly hyperechoic 1.5 cm focus at the hepatic dome may reflect a small hemangioma. Electronically Signed   By: Garald Balding M.D.   On: 02/02/2015 21:23    Medications / Allergies:  Scheduled Meds: . heparin  5,000 Units Subcutaneous 3 times per day  . pantoprazole (PROTONIX) IV  40 mg Intravenous  QHS  . piperacillin-tazobactam (ZOSYN)  IV  3.375 g Intravenous 3 times per day   Continuous Infusions: . dextrose 5 % and 0.9 % NaCl with KCl 20 mEq/L 125 mL/hr at 02/03/15 1317   PRN Meds:.morphine injection, ondansetron **OR** ondansetron (ZOFRAN) IV  Antibiotics: Anti-infectives    Start     Dose/Rate Route Frequency Ordered Stop   02/03/15 0300  piperacillin-tazobactam (ZOSYN) IVPB 3.375 g     3.375 g 12.5 mL/hr over 240 Minutes Intravenous 3 times per day 02/02/15 1927     02/02/15 1800  piperacillin-tazobactam (ZOSYN) IVPB 3.375 g     3.375 g 12.5 mL/hr over 240 Minutes Intravenous  Once 02/02/15 1752 02/02/15 2234        Assessment/Plan Cholecystitis-proceed with laparoscopic cholecystectomy in AM.  Surgical risks discussed including infection, bleeding, injury to surrounding structures, anesthesia risks.  The patient verbalizes understanding and wishes to proceed. ID-zosyn, would give her augmentin post op to complete a 7 day course for URI symptoms. FEN-clears, NPO after midnight VTE prophylaxis-SCD/heparin Dispo-OR tomorrow.  Wants to go home post op, will see how she is doing post operatively.   Erby Pian, [facility] Pager [phone]) For consults and floor pages call [phone]([phone])  02/03/2015 2:53 PM

## 2015-02-03 NOTE — Progress Notes (Signed)
Patient c/o headache unrelieved by IV morphine. MD paged x 3 (1st Dr. Daphine DeutscherMartin and secondly Dr. Michaell CowingGross after 5 pm) via amion; but no response. Reported to oncoming rn to continue to contact MD. Ofilia NeasVwilliams,rn.

## 2015-02-04 ENCOUNTER — Encounter (HOSPITAL_COMMUNITY): Admission: EM | Disposition: A | Discharge status: Home / Self Care | Payer: Self-pay | Source: Home / Self Care

## 2015-02-04 ENCOUNTER — Encounter (HOSPITAL_COMMUNITY): Payer: Self-pay

## 2015-02-04 ENCOUNTER — Inpatient Hospital Stay (HOSPITAL_COMMUNITY): Payer: PRIVATE HEALTH INSURANCE | Admitting: Certified Registered Nurse Anesthetist

## 2015-02-04 ENCOUNTER — Inpatient Hospital Stay (HOSPITAL_COMMUNITY): Payer: PRIVATE HEALTH INSURANCE

## 2015-02-04 HISTORY — PX: CHOLECYSTECTOMY: SHX55

## 2015-02-04 LAB — CBC
HEMATOCRIT: 34.9 % — AB (ref 36.0–46.0)
Hemoglobin: 11.5 g/dL — ABNORMAL LOW (ref 12.0–15.0)
MCH: 29.8 pg (ref 26.0–34.0)
MCHC: 33 g/dL (ref 30.0–36.0)
MCV: 90.4 fL (ref 78.0–100.0)
PLATELETS: 220 10*3/uL (ref 150–400)
RBC: 3.86 MIL/uL — AB (ref 3.87–5.11)
RDW: 13.6 % (ref 11.5–15.5)
WBC: 15.1 10*3/uL — ABNORMAL HIGH (ref 4.0–10.5)

## 2015-02-04 LAB — SURGICAL PCR SCREEN
MRSA, PCR: INVALID — AB
STAPHYLOCOCCUS AUREUS: INVALID — AB

## 2015-02-04 LAB — CREATININE, SERUM
Creatinine, Ser: 0.55 mg/dL (ref 0.44–1.00)
GFR calc Af Amer: >60 mL/min (ref >60)
GFR calc non Af Amer: >60 mL/min (ref >60)

## 2015-02-04 SURGERY — LAPAROSCOPIC CHOLECYSTECTOMY WITH INTRAOPERATIVE CHOLANGIOGRAM
Anesthesia: General

## 2015-02-04 MED ORDER — FENTANYL CITRATE (PF) 250 MCG/5ML IJ SOLN
INTRAMUSCULAR | Status: AC
  Filled 2015-02-04: qty 5

## 2015-02-04 MED ORDER — ONDANSETRON HCL 4 MG/2ML IJ SOLN: 4.0000 mg | Freq: Four times a day (QID) | INTRAMUSCULAR | Status: DC | PRN

## 2015-02-04 MED ORDER — EPHEDRINE SULFATE 50 MG/ML IJ SOLN
INTRAMUSCULAR | Status: DC | PRN
  Administered 2015-02-04: 5 mg via INTRAVENOUS

## 2015-02-04 MED ORDER — HYDROMORPHONE HCL 1 MG/ML IJ SOLN: 0.2500 mg | INTRAMUSCULAR | Status: DC | PRN

## 2015-02-04 MED ORDER — BUPRENORPHINE HCL 0.3 MG/ML IJ SOLN
0.1500 mg | INTRAMUSCULAR | Status: DC | PRN
  Administered 2015-02-04 – 2015-02-05 (×4): 0.15 mg via INTRAVENOUS
  Filled 2015-02-04 (×4): qty 1

## 2015-02-04 MED ORDER — PANTOPRAZOLE SODIUM 40 MG IV SOLR
40.0000 mg | Freq: Every day | INTRAVENOUS | Status: DC
  Administered 2015-02-04: 40 mg via INTRAVENOUS
  Filled 2015-02-04 (×2): qty 40

## 2015-02-04 MED ORDER — SODIUM CHLORIDE 0.9 % IJ SOLN
INTRAMUSCULAR | Status: AC
  Filled 2015-02-04: qty 20

## 2015-02-04 MED ORDER — KCL IN DEXTROSE-NACL 20-5-0.45 MEQ/L-%-% IV SOLN
INTRAVENOUS | Status: DC
  Administered 2015-02-04: 13:00:00 via INTRAVENOUS
  Filled 2015-02-04 (×3): qty 1000

## 2015-02-04 MED ORDER — ONDANSETRON 4 MG PO TBDP: 4.0000 mg | ORAL_TABLET | Freq: Four times a day (QID) | ORAL | Status: DC | PRN

## 2015-02-04 MED ORDER — BUPIVACAINE LIPOSOME 1.3 % IJ SUSP
INTRAMUSCULAR | Status: DC | PRN
  Administered 2015-02-04: 20 mL

## 2015-02-04 MED ORDER — NEOSTIGMINE METHYLSULFATE 10 MG/10ML IV SOLN
INTRAVENOUS | Status: DC | PRN
  Administered 2015-02-04: 2 mg via INTRAVENOUS

## 2015-02-04 MED ORDER — FENTANYL CITRATE (PF) 100 MCG/2ML IJ SOLN
INTRAMUSCULAR | Status: DC | PRN
  Administered 2015-02-04: 50 ug via INTRAVENOUS
  Administered 2015-02-04: 100 ug via INTRAVENOUS

## 2015-02-04 MED ORDER — TRAMADOL HCL 50 MG PO TABS
100.0000 mg | ORAL_TABLET | Freq: Two times a day (BID) | ORAL | Status: DC | PRN
  Administered 2015-02-04 – 2015-02-05 (×2): 100 mg via ORAL
  Filled 2015-02-04 (×2): qty 2

## 2015-02-04 MED ORDER — MIDAZOLAM HCL 5 MG/5ML IJ SOLN
INTRAMUSCULAR | Status: DC | PRN
  Administered 2015-02-04: 2 mg via INTRAVENOUS

## 2015-02-04 MED ORDER — MIDAZOLAM HCL 2 MG/2ML IJ SOLN
INTRAMUSCULAR | Status: AC
  Filled 2015-02-04: qty 2

## 2015-02-04 MED ORDER — BUPIVACAINE LIPOSOME 1.3 % IJ SUSP
20.0000 mL | Freq: Once | INTRAMUSCULAR | Status: DC
  Filled 2015-02-04: qty 20

## 2015-02-04 MED ORDER — LIDOCAINE HCL (CARDIAC) 20 MG/ML IV SOLN
INTRAVENOUS | Status: DC | PRN
  Administered 2015-02-04: 50 mg via INTRAVENOUS

## 2015-02-04 MED ORDER — PROMETHAZINE HCL 25 MG/ML IJ SOLN: 6.2500 mg | INTRAMUSCULAR | Status: DC | PRN

## 2015-02-04 MED ORDER — PROPOFOL 10 MG/ML IV BOLUS
INTRAVENOUS | Status: DC | PRN
  Administered 2015-02-04: 40 mg via INTRAVENOUS
  Administered 2015-02-04: 120 mg via INTRAVENOUS

## 2015-02-04 MED ORDER — LACTATED RINGERS IR SOLN
Status: DC | PRN
  Administered 2015-02-04: 1000 mL

## 2015-02-04 MED ORDER — 0.9 % SODIUM CHLORIDE (POUR BTL) OPTIME
TOPICAL | Status: DC | PRN
  Administered 2015-02-04: 1000 mL

## 2015-02-04 MED ORDER — GLYCOPYRROLATE 0.2 MG/ML IJ SOLN
INTRAMUSCULAR | Status: DC | PRN
  Administered 2015-02-04 (×2): 0.2 mg via INTRAVENOUS

## 2015-02-04 MED ORDER — ROCURONIUM BROMIDE 100 MG/10ML IV SOLN
INTRAVENOUS | Status: DC | PRN
  Administered 2015-02-04: 10 mg via INTRAVENOUS
  Administered 2015-02-04: 30 mg via INTRAVENOUS
  Administered 2015-02-04: 10 mg via INTRAVENOUS

## 2015-02-04 MED ORDER — ONDANSETRON HCL 4 MG/2ML IJ SOLN
INTRAMUSCULAR | Status: DC | PRN
  Administered 2015-02-04: 4 mg via INTRAVENOUS

## 2015-02-04 MED ORDER — LACTATED RINGERS IV SOLN
INTRAVENOUS | Status: DC
  Administered 2015-02-04: 1000 mL via INTRAVENOUS
  Administered 2015-02-04: 11:00:00 via INTRAVENOUS

## 2015-02-04 MED ORDER — IOHEXOL 300 MG/ML  SOLN
INTRAMUSCULAR | Status: DC | PRN
  Administered 2015-02-04: 50 mL via INTRAVENOUS

## 2015-02-04 MED ORDER — MORPHINE SULFATE (PF) 2 MG/ML IV SOLN
INTRAVENOUS | Status: AC
  Administered 2015-02-04: 2 mg via INTRAMUSCULAR
  Filled 2015-02-04: qty 1

## 2015-02-04 MED ORDER — HEPARIN SODIUM (PORCINE) 5000 UNIT/ML IJ SOLN
5000.0000 [IU] | Freq: Three times a day (TID) | INTRAMUSCULAR | Status: DC
  Administered 2015-02-04 – 2015-02-05 (×2): 5000 [IU] via SUBCUTANEOUS
  Filled 2015-02-04 (×5): qty 1

## 2015-02-04 SURGICAL SUPPLY — 42 items
APPLICATOR ARISTA FLEXITIP XL (MISCELLANEOUS) ×3 IMPLANT
APPLIER CLIP ROT 10 11.4 M/L (STAPLE) ×3
BENZOIN TINCTURE PRP APPL 2/3 (GAUZE/BANDAGES/DRESSINGS) IMPLANT
CABLE HIGH FREQUENCY MONO STRZ (ELECTRODE) ×3 IMPLANT
CATH REDDICK CHOLANGI 4FR 50CM (CATHETERS) ×3 IMPLANT
CLIP APPLIE ROT 10 11.4 M/L (STAPLE) ×1 IMPLANT
CLOSURE WOUND 1/2 X4 (GAUZE/BANDAGES/DRESSINGS)
COVER MAYO STAND STRL (DRAPES) ×3 IMPLANT
COVER SURGICAL LIGHT HANDLE (MISCELLANEOUS) ×3 IMPLANT
DECANTER SPIKE VIAL GLASS SM (MISCELLANEOUS) IMPLANT
DRAPE C-ARM 42X120 X-RAY (DRAPES) ×3 IMPLANT
DRAPE LAPAROSCOPIC ABDOMINAL (DRAPES) ×3 IMPLANT
ELECT L-HOOK LAP 45CM DISP (ELECTROSURGICAL)
ELECT PENCIL ROCKER SW 15FT (MISCELLANEOUS) ×3 IMPLANT
ELECT REM PT RETURN 9FT ADLT (ELECTROSURGICAL) ×3
ELECTRODE L-HOOK LAP 45CM DISP (ELECTROSURGICAL) IMPLANT
ELECTRODE REM PT RTRN 9FT ADLT (ELECTROSURGICAL) ×1 IMPLANT
GLOVE BIOGEL M 8.0 STRL (GLOVE) ×3 IMPLANT
GOWN STRL REUS W/TWL XL LVL3 (GOWN DISPOSABLE) ×12 IMPLANT
HEMOSTAT ARISTA ABSORB 3G PWDR (MISCELLANEOUS) ×3 IMPLANT
HEMOSTAT SURGICEL 4X8 (HEMOSTASIS) IMPLANT
IV CATH 14GX2 1/4 (CATHETERS) ×3 IMPLANT
KIT BASIN OR (CUSTOM PROCEDURE TRAY) ×3 IMPLANT
L-HOOK LAP DISP 36CM (ELECTROSURGICAL) ×3
LHOOK LAP DISP 36CM (ELECTROSURGICAL) ×1 IMPLANT
LIQUID BAND (GAUZE/BANDAGES/DRESSINGS) ×3 IMPLANT
POUCH RETRIEVAL ECOSAC 10 (ENDOMECHANICALS) IMPLANT
POUCH RETRIEVAL ECOSAC 10MM (ENDOMECHANICALS)
POUCH SPECIMEN RETRIEVAL 10MM (ENDOMECHANICALS) ×3 IMPLANT
SCISSORS LAP 5X45 EPIX DISP (ENDOMECHANICALS) ×3 IMPLANT
SCRUB PCMX 4 OZ (MISCELLANEOUS) ×3 IMPLANT
SET IRRIG TUBING LAPAROSCOPIC (IRRIGATION / IRRIGATOR) ×3 IMPLANT
SLEEVE XCEL OPT CAN 5 100 (ENDOMECHANICALS) ×6 IMPLANT
STRIP CLOSURE SKIN 1/2X4 (GAUZE/BANDAGES/DRESSINGS) IMPLANT
SUT VIC AB 4-0 SH 18 (SUTURE) ×3 IMPLANT
SYR 20CC LL (SYRINGE) ×3 IMPLANT
TOWEL OR 17X26 10 PK STRL BLUE (TOWEL DISPOSABLE) ×3 IMPLANT
TRAY CATH 16FR W/PLASTIC CATH (SET/KITS/TRAYS/PACK) ×3 IMPLANT
TRAY LAPAROSCOPIC (CUSTOM PROCEDURE TRAY) ×3 IMPLANT
TROCAR BLADELESS OPT 5 100 (ENDOMECHANICALS) ×3 IMPLANT
TROCAR XCEL BLUNT TIP 100MML (ENDOMECHANICALS) IMPLANT
TROCAR XCEL NON-BLD 11X100MML (ENDOMECHANICALS) ×3 IMPLANT

## 2015-02-04 NOTE — Interval H&P Note (Signed)
History and Physical Interval Note:  02/04/2015 8:30 AM  Elizabeth Mcknight  has presented today for surgery, with the diagnosis of CHOLECYSTITIS  The various methods of treatment have been discussed with the patient and family. After consideration of risks, benefits and other options for treatment, the patient has consented to  Procedure(s): LAPAROSCOPIC CHOLECYSTECTOMY WITH POSSIBLE INTRAOPERATIVE CHOLANGIOGRAM (N/A) as a surgical intervention .  The patient's history has been reviewed, patient examined, no change in status, stable for surgery.  I have reviewed the patient's chart and labs.  Questions were answered to the patient's satisfaction.     Elizabeth Mcknight

## 2015-02-04 NOTE — Transfer of Care (Signed)
Immediate Anesthesia Transfer of Care Note  Patient: Elizabeth Mcknight  Procedure(s) Performed: Procedure(s): LAPAROSCOPIC CHOLECYSTECTOMY WITH INTRAOPERATIVE CHOLANGIOGRAM (N/A)  Patient Location: PACU  Anesthesia Type:General  Level of Consciousness: awake, alert  and oriented  Airway & Oxygen Therapy: Patient Spontanous Breathing and Patient connected to face mask oxygen  Post-op Assessment: Report given to RN and Post -op Vital signs reviewed and stable  Post vital signs: Reviewed and stable  Last Vitals:  Filed Vitals:   02/04/15 0537  BP: 94/50  Pulse: 56  Temp: 36.7 C  Resp: 18    Complications: No apparent anesthesia complications

## 2015-02-04 NOTE — Progress Notes (Signed)
Pt had expressed desire for discharge. Contacted Dr Corliss Skainssuei who checked with Dr Daphine DeutscherMartin. Pt is to stay tonight for monitoring, expect discharge in AM. Explained to pt who accepted reason. Delailah Spieth, [employer]

## 2015-02-04 NOTE — Op Note (Signed)
Elizabeth Mcknight   02/04/2015  10:43 AM  Procedure: Laparoscopic Cholecystectomy with intraoperative cholangiogram  Surgeon: Susy FrizzleMatt B. Daphine DeutscherMartin, MD, FACS Asst:  none  Anes:  General  Drains:  None  Findings: cholecystitis  Description of Procedure: [facility] and given [facility].  The patient was prepped with PCMX and draped sterilely. A time out was performed.  Access to the abdomen was achieved with a 5 mm Optiview throught the right upper quadrant.  Port placement included three 5 mm trocars and one 11 in the upper midline.  The patient was quite small and we had to cut in to her large bat tatoo in the upper midline.    The gallbladder was visualized and the fundus was grasped and the gallbladder was elevated. Traction on the infundibulum allowed for successful demonstration of the critical view. Inflammatory changes were acute with some edema.  The cystic duct was identified and clipped up on the gallbladder and an incision was made in the cystic duct and the Reddick catheter was inserted after milking the cystic duct of any debris. A dynamic cholangiogram was performed which demonstrated good intrahepatic filling and free flow into the duodenum.    The cystic duct was then triple clipped and divided, the cystic artery was double clipped and divided and then the gallbladder was removed from the gallbladder bed. Removal of the gallbladder from the gallbladder bed was performed with the hook cautery.  Bleeding controlled with cautery and Arista.  The gallbladder was then placed in a bag and brought out through one of the trocar sites. The gallbladder bed was inspected and no bleeding or bile leaks were seen.   Laparoscopic visualization was used when closing fascial defects for trocar sites.   Incisions were injected with Exparel and closed with 4-0 Vicryl and Liquiban on the skin.  Sponge and needle count were correct.    The patient was taken to the recovery  room in satisfactory condition.

## 2015-02-04 NOTE — Anesthesia Postprocedure Evaluation (Signed)
  Anesthesia Post-op Note  Patient: Elizabeth Mcknight  Procedure(s) Performed: Procedure(s): LAPAROSCOPIC CHOLECYSTECTOMY WITH INTRAOPERATIVE CHOLANGIOGRAM (N/A)  Patient Location: PACU  Anesthesia Type:General  Level of Consciousness: awake  Airway and Oxygen Therapy: Patient Spontanous Breathing  Post-op Pain: mild  Post-op Assessment: Post-op Vital signs reviewed              Post-op Vital Signs: Reviewed  Last Vitals:  Filed Vitals:   02/04/15 1700  BP: 93/58  Pulse: 52  Temp: 37 C  Resp: 16    Complications: No apparent anesthesia complications

## 2015-02-04 NOTE — Progress Notes (Signed)
Per dr. Daphine DeutscherMartin, will use Ultram and Buprinex for pain control at this time.

## 2015-02-04 NOTE — Anesthesia Preprocedure Evaluation (Addendum)
Anesthesia Evaluation  Patient identified by MRN, date of birth, ID band Patient awake    Reviewed: Allergy & Precautions, NPO status , Patient's Chart, lab work & pertinent test results  Airway Mallampati: II  TM Distance: >3 FB Neck ROM: Full    Dental   Pulmonary neg pulmonary ROS,  breath sounds clear to auscultation        Cardiovascular negative cardio ROS  Rhythm:Regular Rate:Normal     Neuro/Psych    GI/Hepatic negative GI ROS, Neg liver ROS,   Endo/Other  negative endocrine ROS  Renal/GU negative Renal ROS     Musculoskeletal   Abdominal   Peds  Hematology negative hematology ROS (+)   Anesthesia Other Findings   Reproductive/Obstetrics                            Anesthesia Physical Anesthesia Plan  ASA: II  Anesthesia Plan: General   Post-op Pain Management:    Induction: Intravenous  Airway Management Planned: Oral ETT  Additional Equipment:   Intra-op Plan:   Post-operative Plan: Extubation in OR  Informed Consent: I have reviewed the patients History and Physical, chart, labs and discussed the procedure including the risks, benefits and alternatives for the proposed anesthesia with the patient or authorized representative who has indicated his/her understanding and acceptance.   Dental advisory given  Plan Discussed with: CRNA and Anesthesiologist  Anesthesia Plan Comments:        Anesthesia Quick Evaluation  

## 2015-02-04 NOTE — Anesthesia Procedure Notes (Signed)
Procedure Name: Intubation Performed by: Martita Brumm J Pre-anesthesia Checklist: Patient identified, Emergency Drugs available, Suction available, Patient being monitored and Timeout performed Patient Re-evaluated:Patient Re-evaluated prior to inductionOxygen Delivery Method: Circle system utilized Preoxygenation: Pre-oxygenation with 100% oxygen Intubation Type: IV induction Ventilation: Mask ventilation without difficulty Laryngoscope Size: Mac and 3 Grade View: Grade I Tube type: Oral Tube size: 7.0 mm Number of attempts: 1 Airway Equipment and Method: Stylet Secured at: 21 cm Tube secured with: Tape Dental Injury: Teeth and Oropharynx as per pre-operative assessment        

## 2015-02-05 DIAGNOSIS — Z9049 Acquired absence of other specified parts of digestive tract: Secondary | ICD-10-CM

## 2015-02-05 LAB — CBC
HEMATOCRIT: 34.4 % — AB (ref 36.0–46.0)
HEMOGLOBIN: 11.2 g/dL — AB (ref 12.0–15.0)
MCH: 29.8 pg (ref 26.0–34.0)
MCHC: 32.6 g/dL (ref 30.0–36.0)
MCV: 91.5 fL (ref 78.0–100.0)
Platelets: 222 10*3/uL (ref 150–400)
RBC: 3.76 MIL/uL — ABNORMAL LOW (ref 3.87–5.11)
RDW: 13.7 % (ref 11.5–15.5)
WBC: 12.6 10*3/uL — ABNORMAL HIGH (ref 4.0–10.5)

## 2015-02-05 LAB — MRSA CULTURE

## 2015-02-05 LAB — COMPREHENSIVE METABOLIC PANEL
ALBUMIN: 3.2 g/dL — AB (ref 3.5–5.0)
ALT: 19 U/L (ref 14–54)
ANION GAP: 7 (ref 5–15)
AST: 20 U/L (ref 15–41)
Alkaline Phosphatase: 38 U/L (ref 38–126)
BUN: <5 mg/dL — ABNORMAL LOW (ref 6–20)
CHLORIDE: 103 mmol/L (ref 101–111)
CO2: 28 mmol/L (ref 22–32)
Calcium: 8.4 mg/dL — ABNORMAL LOW (ref 8.9–10.3)
Creatinine, Ser: 0.63 mg/dL (ref 0.44–1.00)
GFR calc Af Amer: >60 mL/min (ref >60)
GFR calc non Af Amer: >60 mL/min (ref >60)
GLUCOSE: 94 mg/dL (ref 65–99)
POTASSIUM: 3.4 mmol/L — AB (ref 3.5–5.1)
SODIUM: 138 mmol/L (ref 135–145)
TOTAL PROTEIN: 6.1 g/dL — AB (ref 6.5–8.1)
Total Bilirubin: 1.1 mg/dL (ref 0.3–1.2)

## 2015-02-05 MED ORDER — TRAMADOL HCL 50 MG PO TABS: 100.0000 mg | ORAL_TABLET | Freq: Two times a day (BID) | ORAL | Status: AC | PRN

## 2015-02-05 MED ORDER — METHOCARBAMOL 500 MG PO TABS
1000.0000 mg | ORAL_TABLET | Freq: Three times a day (TID) | ORAL | Status: DC | PRN
  Administered 2015-02-05: 1000 mg via ORAL
  Filled 2015-02-05: qty 2

## 2015-02-05 NOTE — Discharge Summary (Signed)
Physician Discharge Summary  Patient ID: Elizabeth Mcknight MRN: [id number] DOB/AGE: [birth year] 19 y.o.  Admit date: 02/02/2015 Discharge date: 02/05/2015  Admission Diagnoses:  Acute cholecystitis  Discharge Diagnoses:  same  Principal Problem:   S/P laparoscopic cholecystectomy Nov 2016   Surgery:  Lap chole IOC  Discharged Condition: improved  Hospital Course:   Had surgery.  Kept overnight and ready for discharge.  Has been in heroin addiction program.  Will discharge on Tramadol  Consults: none  Significant Diagnostic Studies: IOB    Discharge Exam: Blood pressure 98/68, pulse 59, temperature 98.4 F (36.9 C), temperature source Oral, resp. rate 16, height 5\' 3"  (1.6 m), weight 45.36 kg (100 lb), last menstrual period 01/02/2015, SpO2 96 %. Incisions ok  Disposition: Final discharge disposition not confirmed     Medication List    ASK your doctor about these medications        EXCEDRIN PO  Take 1-2 tablets by mouth 2 (two) times daily as needed (for pain).     MIDOL 200 MG Caps  Generic drug:  Ibuprofen  Take 1 capsule by mouth every 6 (six) hours as needed (for pain).           Follow-up Information    Follow up with Resources provided for follow up care and medications.   Why:  As needed   Contact information:   Please go to website on the back of your insurance card to locate a doctor in network for primary care provider and specialists  Please verify any provider recommended to you in ED is in network You may also use the other resources provided       Follow up with Elizabeth Mcknight,Elizabeth Aspinall B, MD. Schedule an appointment as soon as possible for a visit in 3 weeks.   Specialty:  General Surgery   Contact information:   [address] [place] [id number] [phone]       Signed: Valarie MerinoMARTIN,Jaycelynn Knickerbocker Mcknight 02/05/2015, 8:47 AM

## 2015-02-05 NOTE — Discharge Instructions (Signed)
Laparoscopic Cholecystectomy Laparoscopic cholecystectomy is surgery to remove the gallbladder. The gallbladder is located in the upper right part of the abdomen, behind the liver. It is a storage sac for bile, which is produced in the liver. Bile aids in the digestion and absorption of fats. Cholecystectomy is often done for inflammation of the gallbladder (cholecystitis). This condition is usually caused by a buildup of gallstones (cholelithiasis) in the gallbladder. Gallstones can block the flow of bile, and that can result in inflammation and pain. In severe cases, emergency surgery may be required. If emergency surgery is not required, you will have time to prepare for the procedure. Laparoscopic surgery is an alternative to open surgery. Laparoscopic surgery has a shorter recovery time. Your common bile duct may also need to be examined during the procedure. If stones are found in the common bile duct, they may be removed. LET [facility] CARE PROVIDER KNOW ABOUT:  Any allergies you have.  All medicines you are taking, including vitamins, herbs, eye drops, creams, and over-the-counter medicines.  Previous problems you or members of your family have had with the use of anesthetics.  Any blood disorders you have.  Previous surgeries you have had.  Any medical conditions you have. RISKS AND COMPLICATIONS Generally, this is a safe procedure. However, problems may occur, including:  Infection.  Bleeding.  Allergic reactions to medicines.  Damage to other structures or organs.  A stone remaining in the common bile duct.  A bile leak from the cyst duct that is clipped when your gallbladder is removed.  The need to convert to open surgery, which requires a larger incision in the abdomen. This may be necessary if your surgeon thinks that it is not safe to continue with a laparoscopic procedure. BEFORE THE PROCEDURE  Ask your health care provider about:  Changing or stopping your  regular medicines. This is especially important if you are taking diabetes medicines or blood thinners.  Taking medicines such as aspirin and ibuprofen. These medicines can thin your blood. Do not take these medicines before your procedure if your health care provider instructs you not to.  Follow instructions from your health care provider about eating or drinking restrictions.  Let your health care provider know if you develop a cold or an infection before surgery.  Plan to have someone take you home after the procedure.  Ask your health care provider how your surgical site will be marked or identified.  You may be given antibiotic medicine to help prevent infection. PROCEDURE  To reduce your risk of infection:  Your health care team will wash or sanitize their hands.  Your skin will be washed with soap.  An IV tube may be inserted into one of your veins.  You will be given a medicine to make you fall asleep (general anesthetic).  A breathing tube will be placed in your mouth.  The surgeon will make several small cuts (incisions) in your abdomen.  A thin, lighted tube (laparoscope) that has a tiny camera on the end will be inserted through one of the small incisions. The camera on the laparoscope will send a picture to a TV screen (monitor) in the operating room. This will give the surgeon a good view inside your abdomen.  A gas will be pumped into your abdomen. This will expand your abdomen to give the surgeon more room to perform the surgery.  Other tools that are needed for the procedure will be inserted through the other incisions. The gallbladder will  be removed through one of the incisions.  After your gallbladder has been removed, the incisions will be closed with stitches (sutures), staples, or skin glue.  Your incisions may be covered with a bandage (dressing). The procedure may vary among health care providers and hospitals. AFTER THE PROCEDURE  Your blood  pressure, heart rate, breathing rate, and blood oxygen level will be monitored often until the medicines you were given have worn off.  You will be given medicines as needed to control your pain.   This information is not intended to replace advice given to you by your health care provider. Make sure you discuss any questions you have with your health care provider.   Document Released: 03/06/2005 Document Revised: 11/25/2014 Document Reviewed: 10/16/2012 Elsevier Interactive Patient Education [employer]2016 [employer]  -----------------------------------------------------------------------------------------------------   Your appointment is at 9:45 on 02/24/15, please arrive at least 30 min before your appointment to complete your check in paperwork.  If you are unable to arrive 30 min prior to your appointment time we may have to cancel or reschedule you.  LAPAROSCOPIC SURGERY: POST OP INSTRUCTIONS  1. DIET: Follow a light bland diet the first 24 hours after arrival home, such as soup, liquids, crackers, etc. Be sure to include lots of fluids daily. Avoid fast food or heavy meals as your are more likely to get nauseated. Eat a low fat the next few days after surgery.  2. Take your usually prescribed home medications unless otherwise directed. 3. PAIN CONTROL:  1. Pain is best controlled by a usual combination of three different methods TOGETHER:  1. Ice/Heat 2. Over the counter pain medication 3. Prescription pain medication 2. Most patients will experience some swelling and bruising around the incisions. Ice packs or heating pads (30-60 minutes up to 6 times a day) will help. Use ice for the first few days to help decrease swelling and bruising, then switch to heat to help relax tight/sore spots and speed recovery. Some people prefer to use ice alone, heat alone, alternating between ice & heat. Experiment to what works for you. Swelling and bruising can take several weeks to resolve.  3. It is  helpful to take an over-the-counter pain medication regularly for the first few weeks. Choose one of the following that works best for you:  1. Naproxen (Aleve, etc) Two 220mg  tabs twice a day 2. Ibuprofen (Advil, etc) Three 200mg  tabs four times a day (every meal & bedtime) 3. Acetaminophen (Tylenol, etc) 500-650mg  four times a day (every meal & bedtime) 4. A prescription for pain medication (such as oxycodone, hydrocodone, etc) should be given to you upon discharge. Take your pain medication as prescribed.  1. If you are having problems/concerns with the prescription medicine (does not control pain, nausea, vomiting, rash, itching, etc), please call us [phone] to see if we need to switch you to a different pain medicine that will work better for you and/or control your side effect better. 2. If you need a refill on your pain medication, please contact your pharmacy. They will contact our office to request authorization. Prescriptions will not be filled after 5 pm or on week-ends. 4. Avoid getting constipated. Between the surgery and the pain medications, it is common to experience some constipation. Increasing fluid intake and taking a fiber supplement (such as Metamucil, Citrucel, FiberCon, MiraLax, etc) 1-2 times a day regularly will usually help prevent this problem from occurring. A mild laxative (prune juice, Milk of Magnesia, MiraLax, etc) should be taken according  to package directions if there are no bowel movements after 48 hours.  5. Watch out for diarrhea. If you have many loose bowel movements, simplify your diet to bland foods & liquids for a few days. Stop any stool softeners and decrease your fiber supplement. Switching to mild anti-diarrheal medications (Kayopectate, Pepto Bismol) can help. If this worsens or does not improve, please call us. 6. Wash / shower every day. You may shower over the dressings as they are waterproof. Continue to shower over incision(s) after the dressing  is off. 7. Remove your waterproof bandages 5 days after surgery. You may leave the incision open to air. You may replace a dressing/Band-Aid to cover the incision for comfort if you wish.  8. ACTIVITIES as tolerated:  1. You may resume regular (light) daily activities beginning the next day--such as daily self-care, walking, climbing stairs--gradually increasing activities as tolerated. If you can walk 30 minutes without difficulty, it is safe to try more intense activity such as jogging, treadmill, bicycling, low-impact aerobics, swimming, etc. 2. Save the most intensive and strenuous activity for last such as sit-ups, heavy lifting, contact sports, etc Refrain from any heavy lifting or straining until you are off narcotics for pain control.  3. DO NOT PUSH THROUGH PAIN. Let pain be your guide: If it hurts to do something, don't do it. Pain is your body warning you to avoid that activity for another week until the pain goes down. 4. You may drive when you are no longer taking prescription pain medication, you can comfortably wear a seatbelt, and you can safely maneuver your car and apply brakes. 5. You may have sexual intercourse when it is comfortable.  9. FOLLOW UP in our office  1. Please call [facility] at [phone] to set up an appointment to see your surgeon in the office for a follow-up appointment approximately 2-3 weeks after your surgery. 2. Make sure that you call for this appointment the day you arrive home to insure a convenient appointment time.      10. IF YOU HAVE DISABILITY OR FAMILY LEAVE FORMS, BRING THEM TO THE               OFFICE FOR PROCESSING.   WHEN TO CALL us [phone]:  1. Poor pain control 2. Reactions / problems with new medications (rash/itching, nausea, etc)  3. Fever over 101.5 F (38.5 C) 4. Inability to urinate 5. Nausea and/or vomiting 6. Worsening swelling or bruising 7. Continued bleeding from incision. 8. Increased pain, redness, or drainage from the  incision  The clinic staff is available to answer your questions during regular business hours (8:30am-5pm). Please dont hesitate to call and ask to speak to one of our nurses for clinical concerns.  If you have a medical emergency, go to the nearest emergency room or call 911.  A surgeon from [facility] is always on call at the [facility], [place]  [address], [place], [place] [address] ?  MAIN: [phone] ? TOLL FREE: [phone] ?  FAX [phone]  [email or web address]

## 2015-02-05 NOTE — Progress Notes (Signed)
During my respiratory assessment I heard congested breath sounds.  Day nurse reported this to me also.  Pt stated she has had this for a while, doesent know why, but plans to see medical tx after she is discharged.

## 2015-02-05 NOTE — Care Management Note (Signed)
Case Management Note  Patient Details  Name: Lubertha [place] Moller MRN: [id number] Date of Birth: 1995/10/29  Subjective/Objective:    S/p Laparoscopic Cholecystectomy                Action/Plan: Discharge planning, no needs identified  Expected Discharge Date:   (unknown)               Expected Discharge Plan:  Home/Self Care  In-House Referral:  NA  Discharge planning Services  CM Consult  Post Acute Care Choice:  NA Choice offered to:  NA  DME Arranged:  N/A DME Agency:  NA  HH Arranged:  NA HH Agency:  NA  Status of Service:  Completed, signed off  Medicare Important Message Given:    Date Medicare IM Given:    Medicare IM give by:    Date Additional Medicare IM Given:    Additional Medicare Important Message give by:     If discussed at Long Length of Stay Meetings, dates discussed:    Additional Comments:  Alexis Goodelleele, Garland Hincapie K, RN 02/05/2015, 10:50 AM

## 2016-08-14 IMAGING — CT CT ABD-PELV W/ CM
2 of 4 series · 15 of 46 positions shown, 17 images · IV contrast (OMNIPAQUE 300)
Comparison: None.

CLINICAL DATA: 19-year-old with mid epigastric abdominal pain. Non
bilious emesis. Hematuria.

EXAM:
CT ABDOMEN AND PELVIS WITH CONTRAST
TECHNIQUE: Multidetector CT imaging of the abdomen and pelvis was performed
using the standard protocol following bolus administration of
intravenous contrast.
CONTRAST:  50mL OMNIPAQUE IOHEXOL 300 MG/ML SOLN, 80mL OMNIPAQUE
IOHEXOL 300 MG/ML SOLN

[Series 2: abd/pel with · axial · 0.67mm/px · z∈[+1164,+1534]mm · 12 of 85 slices shown, 14 images]
[im 7/85  soft-tissue]
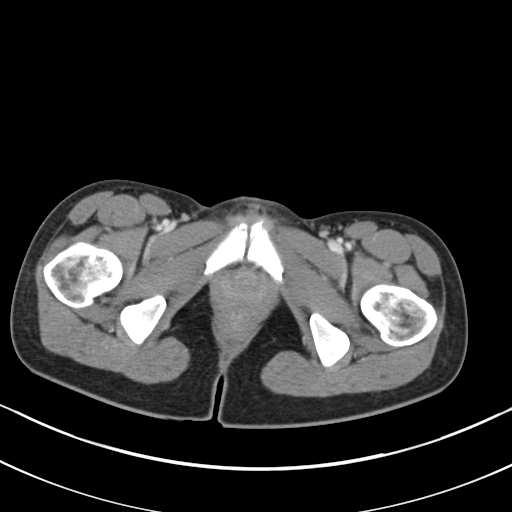
[im 7/85  bone]
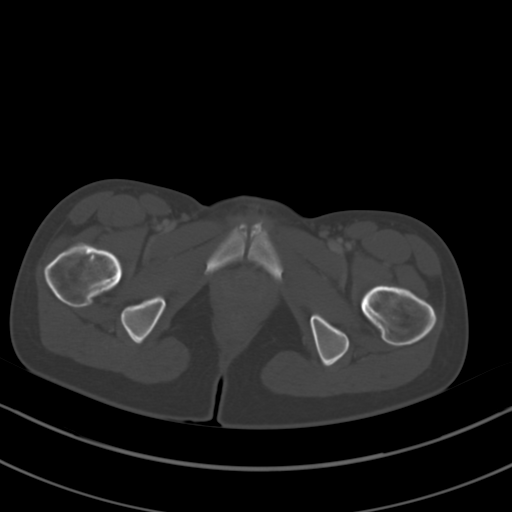
[im 14/85  soft-tissue]
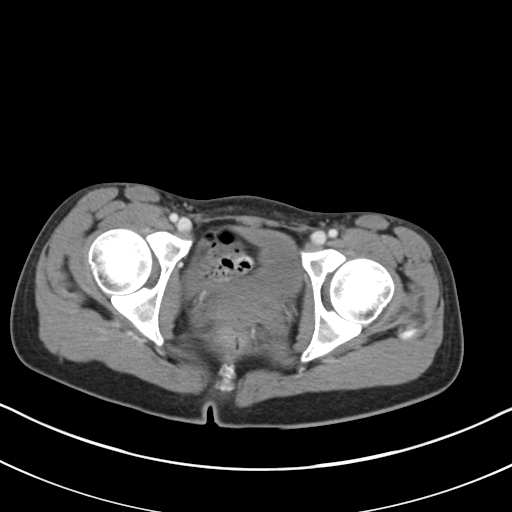
[im 21/85  soft-tissue]
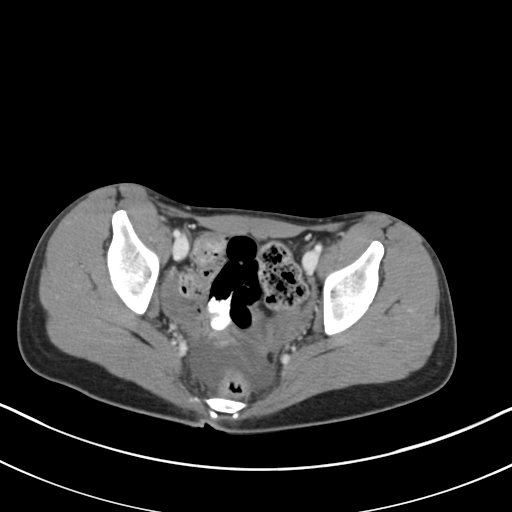
[im 27/85  soft-tissue]
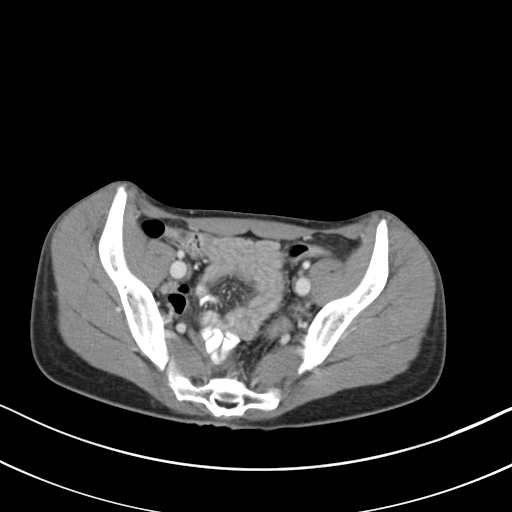
[im 34/85  soft-tissue]
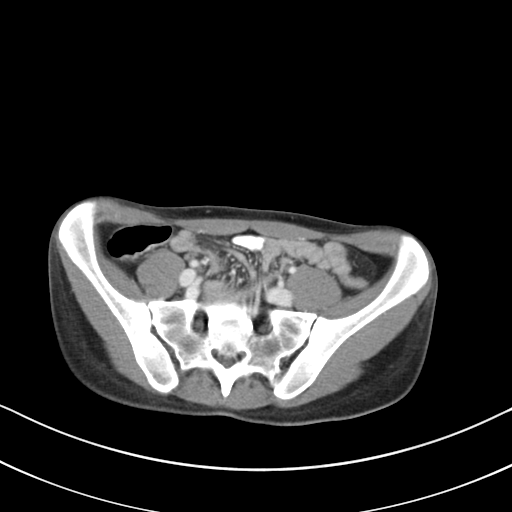
[im 41/85  soft-tissue]
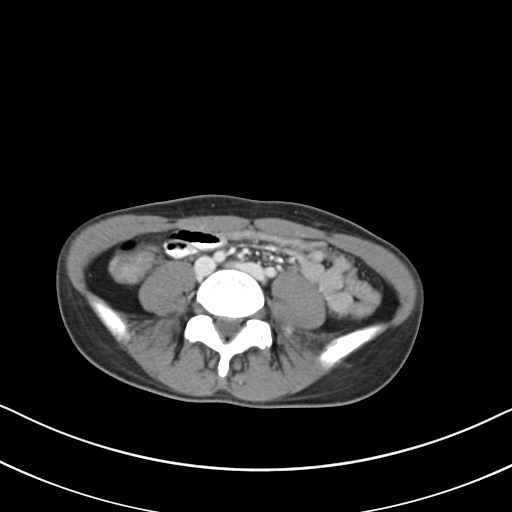
[im 48/85  soft-tissue]
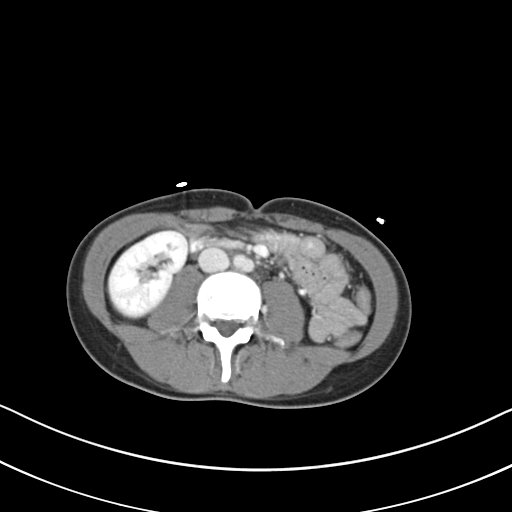
[im 54/85  soft-tissue]
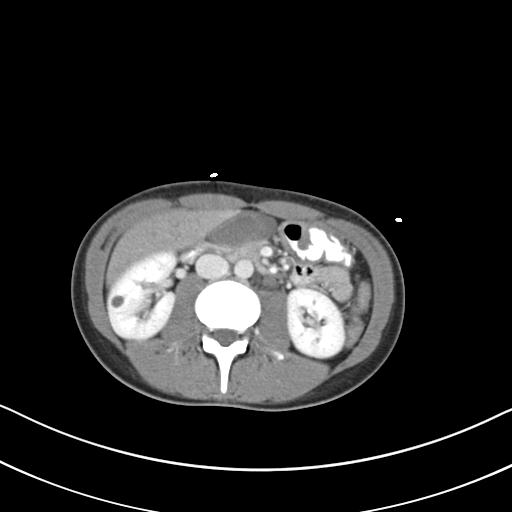
[im 61/85  soft-tissue]
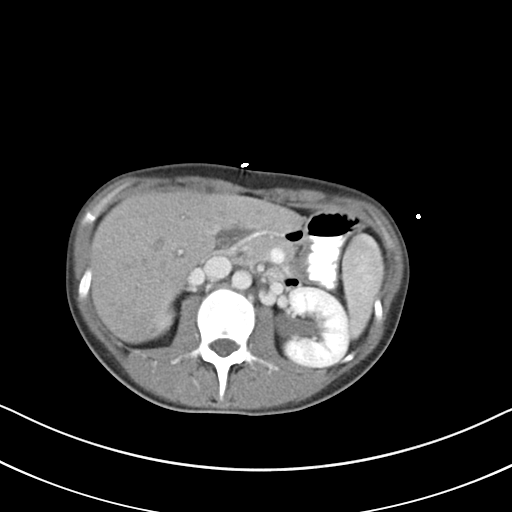
[im 61/85  bone]
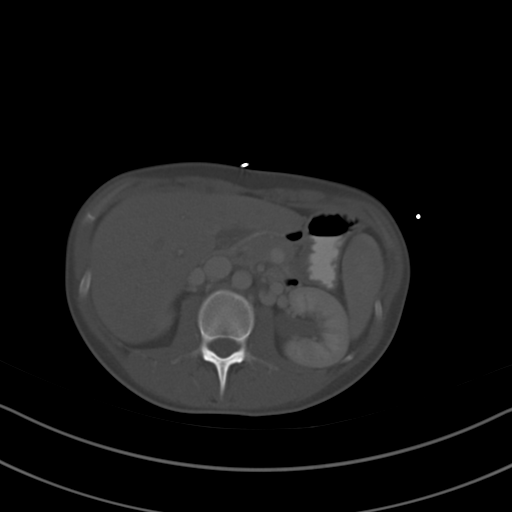
[im 68/85  soft-tissue]
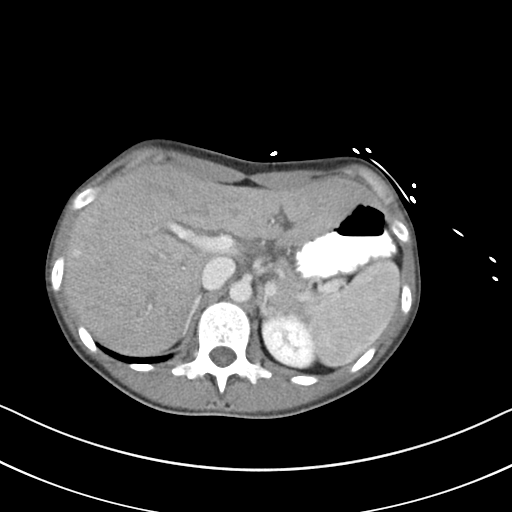
[im 74/85  soft-tissue]
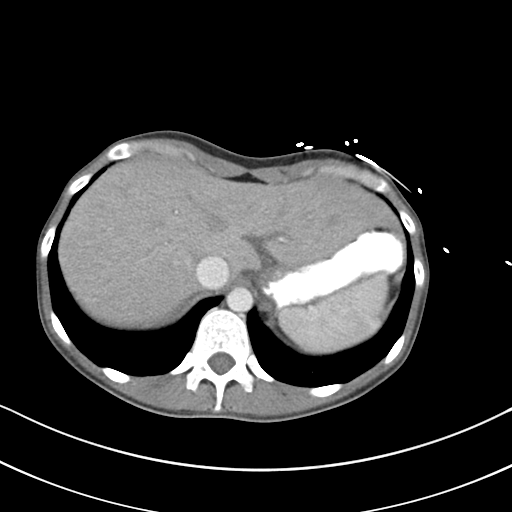
[im 81/85  soft-tissue]
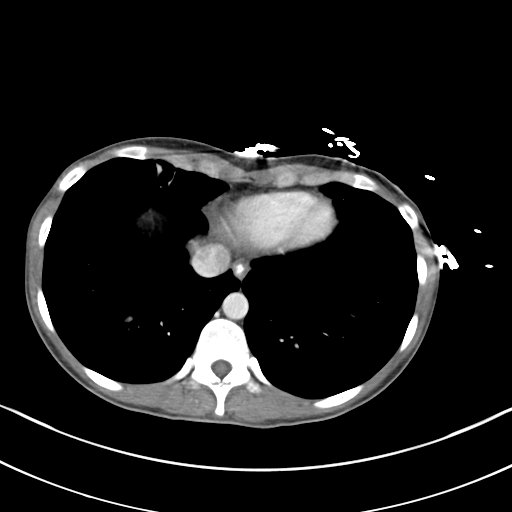

[Series 3: coronal a/|p · coronal · 0.75mm/px · 3 of 57 slices shown]
[im 19/57  soft-tissue]
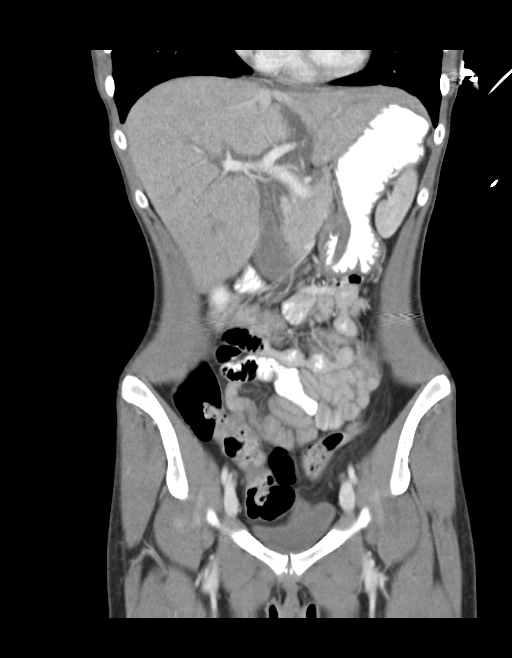
[im 25/57  soft-tissue]
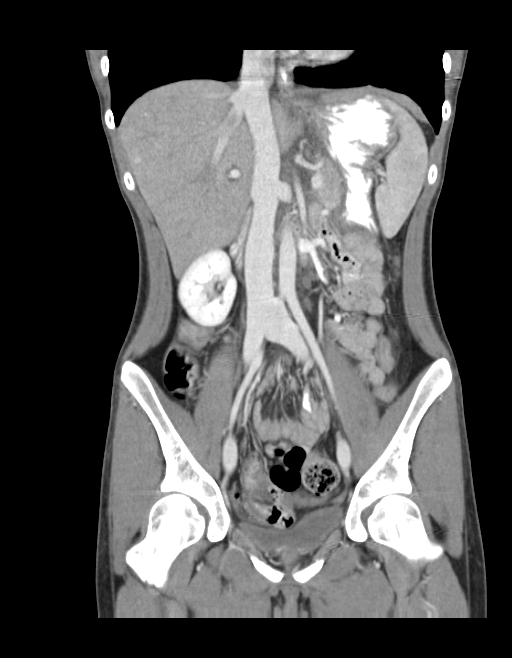
[im 32/57  soft-tissue]
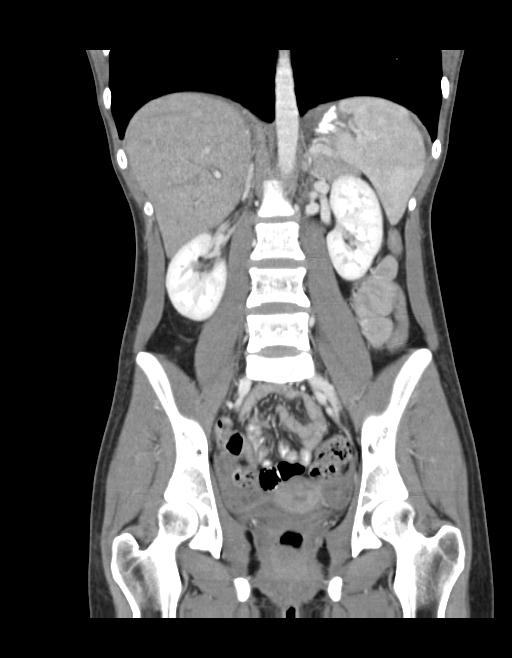

[15 of 46 positions shown; findings below may reference images not displayed]

FINDINGS: Lower chest: Subtle ground-glass and patchy densities at the lung
bases, particularly in the right middle lobe and right lower lobe.
Findings concerning for infection.

Hepatobiliary: There is periportal edema in the liver. 1.1 cm
enhancing or dense lesion along the left hepatic dome could
represent a flash filling hemangioma. Portal venous system is
patent. There appears to be gallbladder wall thickening. Difficult
to exclude pericholecystic fluid. Normal caliber of the common bile
duct.

Pancreas: Normal appearance of the pancreas without inflammation or
duct dilatation.

Spleen: Normal appearance of spleen without inflammation.

Adrenals/Urinary Tract: 0.8 cm cyst in the right kidney upper pole.
Normal appearance of the adrenal glands. Incidentally, there is a
circumaortic left renal vein. Evidence for nonobstructive punctate
stone in left kidney lower pole. Probable extrarenal pelvis on the
left side.

Stomach/Bowel: There is marked narrowing of the proximal duodenum
from the gallbladder. Normal appearance of small bowel without
dilatation. Normal appearance of the colon. Appendix identified in
the right pelvic region without inflammatory changes.

Vascular/Lymphatic: Small low-density nodes in the left periaortic
region. Normal caliber of the abdominal aorta and iliac arteries.
IVC and iliac veins are patent.

Reproductive: Small amount of free fluid in the pelvis. Limited
evaluation of the uterus and adnexal tissue.

Other: Negative for free air.

Musculoskeletal: No acute abnormality.
IMPRESSION: Subtle parenchymal densities at the lung bases, particularly on the
right side. Findings are concerning for subtle infectious or
inflammatory process.

Unusual configuration of the gallbladder raises concern for wall
thickening and cannot exclude pericholecystic fluid. In addition,
there is periportal edema. Recommend further characterization with a
right upper quadrant ultrasound.

Nonobstructive left kidney stone.

Multiple small low-density nodes along the left side of the aorta.
These findings are nonspecific and could be reactive in etiology. No
other significant lymphadenopathy.

Small amount of free fluid in the pelvis could be physiologic but
indeterminate.

1.1 cm dense or enhancing lesion along the left hepatic dome. This
is likely an incidental finding for a patient of this age and could
represent a flash filling hemangioma.

## 2016-08-16 IMAGING — RF DG CHOLANGIOGRAM OPERATIVE
1 series · 3 of 3 positions shown · non-contrast
Comparison: Abdominal ultrasound - 02/02/2015; CT abdomen pelvis -
02/02/2015

CLINICAL DATA: Cholelithiasis.  Laparoscopic cholecystectomy.

EXAM:
INTRAOPERATIVE CHOLANGIOGRAM
FLUOROSCOPY TIME:  Not provided

[Series 1: run · 3 of 89 frames shown]
[frame 14/89]
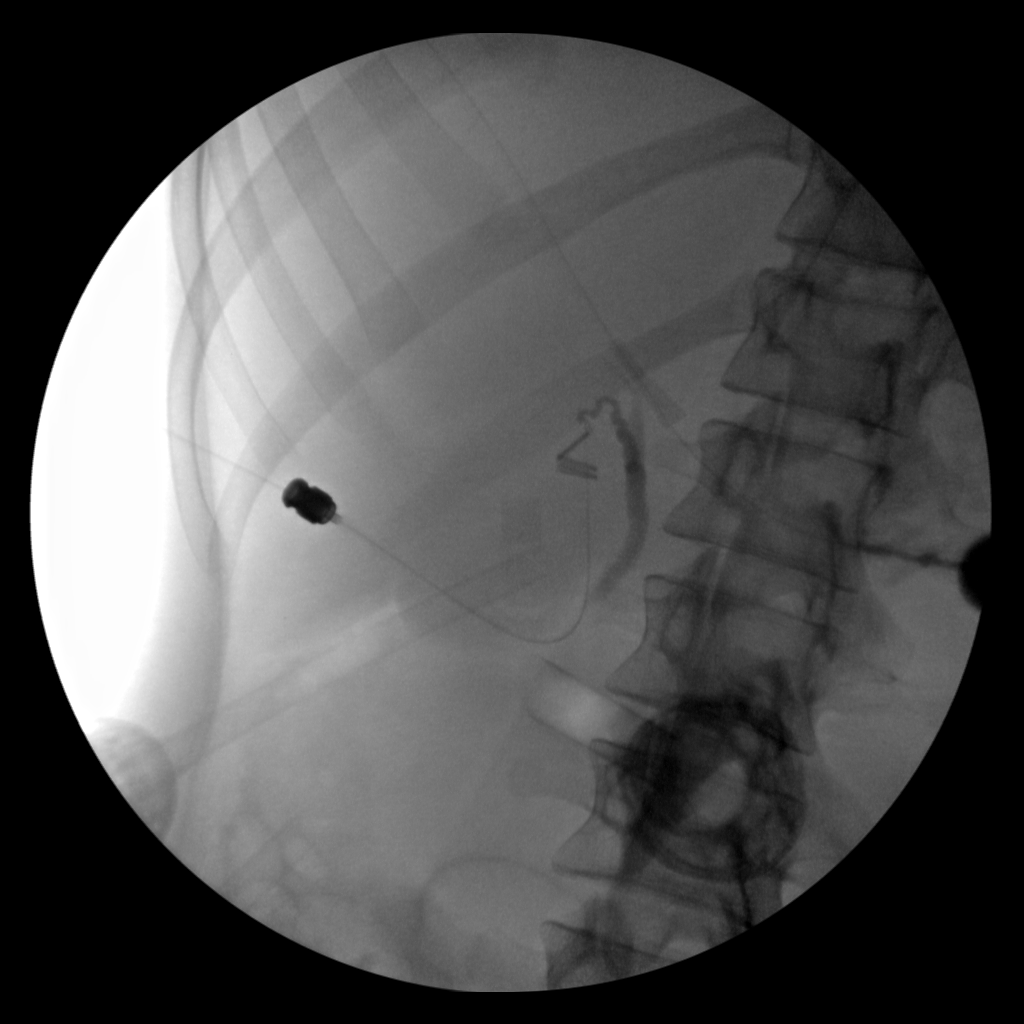
[frame 45/89]
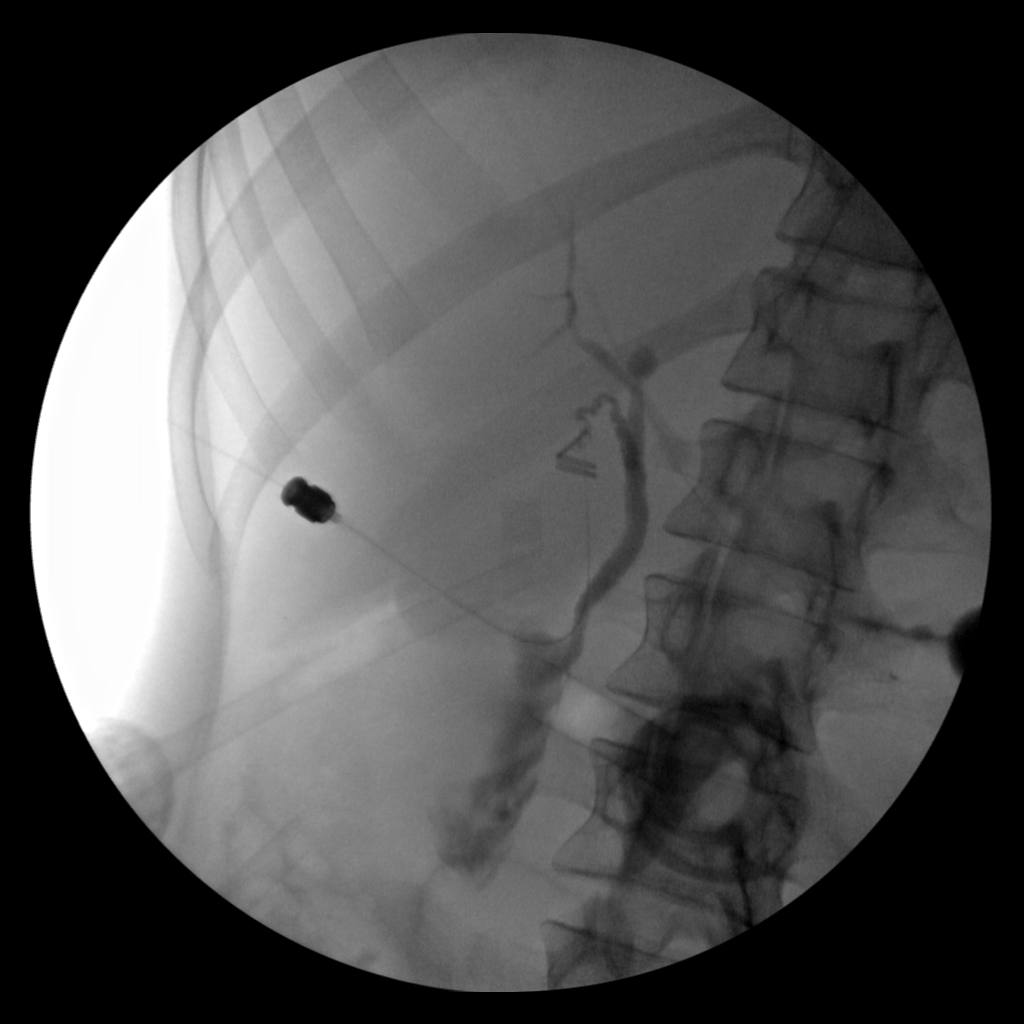
[frame 76/89]
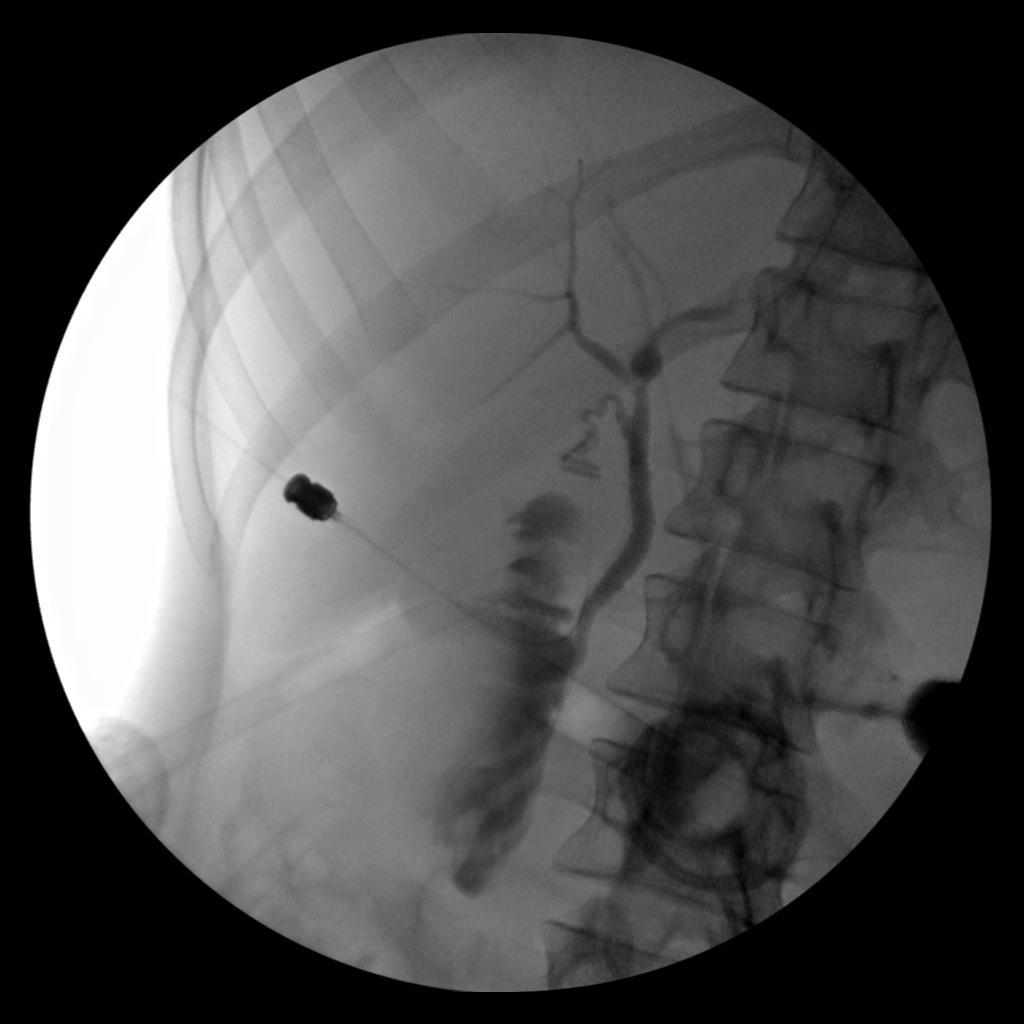

[3 of 3 positions shown; findings below may reference images not displayed]

FINDINGS: Intraoperative cholangiographic images of the right upper abdominal
quadrant during laparoscopic cholecystectomy are provided for
review.

Surgical clips overlie the expected location of the gallbladder
fossa.

Contrast injection demonstrates selective cannulation of the central
aspect of the cystic duct.

There is passage of contrast through the central aspect of the
cystic duct with filling of a non dilated common bile duct. There is
passage of contrast though the CBD and into the descending portion
of the duodenum.

There is minimal reflux of injected contrast into the common hepatic
duct and central aspect of the non dilated intrahepatic biliary
system. There is mild focal saccular dilatation of the central
aspect of the left intrahepatic biliary tree without discrete
intraluminal filling defect.

There are no discrete filling defects within the opacified portions
of the biliary system to suggest the presence of
choledocholithiasis.
IMPRESSION: 1. No evidence of choledocholithiasis.
2. Nonspecific mild focal saccular dilatation involving the central
aspect of the left intrahepatic biliary tree without discrete
intraluminal filling defect.
